# Patient Record
Sex: Male | Born: 1945 | Hispanic: No | Marital: Single | Smoking: Never smoker
Health system: Southern US, Community
[De-identification: ages and names within clinical notes are randomized; demographics above are authoritative.]

## PROBLEM LIST (undated history)

## (undated) DIAGNOSIS — N390 Urinary tract infection, site not specified: Secondary | ICD-10-CM

## (undated) DIAGNOSIS — D649 Anemia, unspecified: Secondary | ICD-10-CM

## (undated) HISTORY — DX: Urinary tract infection, site not specified: N39.0

## (undated) HISTORY — DX: Anemia, unspecified: D64.9

## (undated) HISTORY — PX: COLONOSCOPY: SHX174

---

## 2010-04-01 ENCOUNTER — Encounter: Admission: RE | Admit: 2010-04-01 | Discharge: 2010-04-01 | Payer: Self-pay | Admitting: Infectious Diseases

## 2010-07-12 ENCOUNTER — Emergency Department (HOSPITAL_COMMUNITY)
Admission: EM | Admit: 2010-07-12 | Discharge: 2010-07-12 | Payer: Self-pay | Source: Home / Self Care | Admitting: Family Medicine

## 2010-07-12 ENCOUNTER — Inpatient Hospital Stay (HOSPITAL_COMMUNITY): Admission: EM | Admit: 2010-07-12 | Discharge: 2010-07-16 | Payer: Self-pay | Admitting: Emergency Medicine

## 2010-07-13 ENCOUNTER — Ambulatory Visit: Payer: Self-pay | Admitting: Internal Medicine

## 2010-07-14 ENCOUNTER — Encounter: Payer: Self-pay | Admitting: Internal Medicine

## 2010-07-22 ENCOUNTER — Ambulatory Visit: Payer: Self-pay | Admitting: Internal Medicine

## 2010-07-22 ENCOUNTER — Encounter: Payer: Self-pay | Admitting: Internal Medicine

## 2010-07-22 DIAGNOSIS — N12 Tubulo-interstitial nephritis, not specified as acute or chronic: Secondary | ICD-10-CM | POA: Insufficient documentation

## 2010-07-22 DIAGNOSIS — D649 Anemia, unspecified: Secondary | ICD-10-CM

## 2010-07-22 LAB — CONVERTED CEMR LAB
Bilirubin Urine: NEGATIVE
Ketones, ur: NEGATIVE mg/dL
Ketones, urine, test strip: NEGATIVE
Leukocytes, UA: NEGATIVE
Nitrite: NEGATIVE
Protein, U semiquant: NEGATIVE
Specific Gravity, Urine: 1.014 (ref 1.005–1.0)
Urobilinogen, UA: 0.2
Urobilinogen, UA: 0.2 (ref 0.0–1.0)
pH: 6 (ref 5.0–8.0)

## 2010-07-23 LAB — CONVERTED CEMR LAB
BUN: 16 mg/dL (ref 6–23)
Chloride: 100 meq/L (ref 96–112)
Creatinine, Ser: 1.11 mg/dL (ref 0.40–1.50)
Glucose, Bld: 96 mg/dL (ref 70–99)
HCT: 36.3 % — ABNORMAL LOW (ref 39.0–52.0)
Hemoglobin: 11.6 g/dL — ABNORMAL LOW (ref 13.0–17.0)
LDL Cholesterol: 152 mg/dL — ABNORMAL HIGH (ref 0–99)
Potassium: 4 meq/L (ref 3.5–5.3)
RBC: 3.94 M/uL — ABNORMAL LOW (ref 4.22–5.81)
RDW: 13.1 % (ref 11.5–15.5)
Triglycerides: 158 mg/dL — ABNORMAL HIGH (ref <150)
VLDL: 32 mg/dL (ref 0–40)

## 2010-07-26 ENCOUNTER — Ambulatory Visit (HOSPITAL_COMMUNITY)
Admission: RE | Admit: 2010-07-26 | Discharge: 2010-07-26 | Payer: Self-pay | Source: Home / Self Care | Attending: Internal Medicine | Admitting: Internal Medicine

## 2010-07-27 ENCOUNTER — Encounter: Payer: Self-pay | Admitting: Internal Medicine

## 2010-08-02 ENCOUNTER — Encounter: Payer: Self-pay | Admitting: Internal Medicine

## 2010-09-14 NOTE — Procedures (Signed)
Summary: Colonoscopy  Patient: [name] Note: All result statuses are Final unless otherwise noted.  Tests: (1) Colonoscopy (COL)   COL Colonoscopy           DONE     [facility]     [address]     [place], [place]  [address]           COLONOSCOPY PROCEDURE REPORT           PATIENT:  Austin Thornton, Austin Thornton  MR#:  [id number]     BIRTHDATE:  Aug 15, 1946, 64 yrs. old  GENDER:  male     ENDOSCOPIST:  Iva Boop, MD, [facility]     REF. BY:  Mariea Stable, M.D.     PROCEDURE DATE:  07/14/2010     PROCEDURE:  Colonoscopy 14970     ASA CLASS:  Class II     INDICATIONS:  rectal bleeding     MEDICATIONS:   Fentanyl 50 mcg IV, Versed 5 mg IV           DESCRIPTION OF PROCEDURE:   After the risks benefits and     alternatives of the procedure were thoroughly explained, informed     consent was obtained.  Digital rectal exam was performed and     revealed no abnormalities and normal prostate.   The [id number]     ([id number]) endoscope was introduced through the anus and advanced     to the cecum, which was identified by both the appendix and     ileocecal valve, without limitations.  The quality of the prep was     excellent, using MoviPrep.  The instrument was then slowly     withdrawn as the colon was fully examined. Insertion 3 minutes     Withdrawal 6 minutes     <<PROCEDUREIMAGES>>           FINDINGS:  A normal appearing cecum, ileocecal valve, and     appendiceal orifice were identified. The ascending, hepatic     flexure, transverse, splenic flexure, descending, sigmoid colon,     and rectum appeared unremarkable. Includes right colon     retroflexion.   Retroflexed views in the rectum revealed internal     hemorrhoids.    The scope was then withdrawn from the patient and     the procedure completed.           COMPLICATIONS:  None     ENDOSCOPIC IMPRESSION:     1) Normal colon     2) Internal hemorrhoids     3) excellent prep     RECOMMENDATIONS:     As  needed hemorrhoid care.     treat B12 deficiency, that may be cause of anemia.           Iva Boop, MD, [facility]           n.     eSIGNED:   Iva Boop at 07/14/2010 03:33 PM           Jones Skene, [id number]  Note: An exclamation mark (!) indicates a result that was not dispersed into the flowsheet. Document Creation Date: 07/14/2010 3:40 PM _______________________________________________________________________  (1) Order result status: Final Collection or observation date-time: 07/14/2010 15:26 Requested date-time:  Receipt date-time:  Reported date-time:  Referring Physician:   Ordering Physician: Stan Head [phone]) Specimen Source:  Source: [name] Order Number: [id number] Lab site:

## 2010-09-14 NOTE — Assessment & Plan Note (Signed)
Summary: HFU/ NEW TO CLINIC/ SB.   Vital Signs:  Patient profile:   65 year old male Height:      65.5 inches Weight:      87.02 pounds BMI:     14.31 Temp:     96.8 degrees F oral Pulse rate:   71 / minute Pulse (ortho):   82 / minute BP sitting:   91 / 68  (left arm) BP standing:   102 / 69  (left arm) Cuff size:   small  Vitals Entered By: Angelina Ok RN (July 22, 2010 9:02 AM) Is Patient Diabetic? No Pain Assessment Patient in pain? no      Nutritional Status BMI of < 19 = underweight  Have you ever been in a relationship where you felt threatened, hurt or afraid?No   Does patient need assistance? Functional Status Self care Ambulation Normal Comments Uses an intrepreter.  Dizzines on standing. No nausea. No problems eating.  HFU. Labs.  Needs order for PICC line removal.  Send order to [facility].   History of Present Illness: Follow up from a hospitalization. Please, refer to a D/C summary dictaed by Dr. Allena Katz. Patient denies any dysuria, oliguria, polyuria, fever, chills, sweats, hematuria or hematechezia. Patient reports improved appetitie. Denies any concerns. Patient is on Ertapenem Day #8 via PICC line  Depression History:      The patient denies a depressed mood most of the day and a diminished interest in his usual daily activities.         Preventive Screening-Counseling & Management  Alcohol-Tobacco     Alcohol drinks/day: 0     Smoking Status: never  Problems Prior to Update: None  Current Problems (verified): 1)  Anemia  (ICD-285.9) 2)  Internal Hemorrhoids  (ICD-455.0) 3)  Upper Urinary Tract Infection  (ICD-590.80)  Medications Prior to Update: 1)  None  Current Medications (verified): 1)  Tylenol 325 Mg Tabs (Acetaminophen) .... Take One Tablet By Mouth Q 6 Hours Prn 2)  Protonix 40 Mg Tbec (Pantoprazole Sodium) .... Take One Tablet By Mouth Daily 3)  Cyanocobalamin 1000 Mcg/ml Soln (Cyanocobalamin) .... Take One Tablet By  Mouth Daily  Allergies (verified): No Known Drug Allergies  Directives (verified): 1)  Full Code   Past History:  Family History: Last updated: 07/22/2010 Unknown  Risk Factors: Smoking Status: never (07/22/2010)  Past medical, surgical, family and social histories (including risk factors) reviewed for relevance to current acute and chronic problems (from E-chart)  Past Medical History: E. Coli Upper UTI (06/2010) Anemia Internal Hemorroids (per Colonoscopy in 06/2010).  Past Surgical History: None  Family History: Reviewed history and no changes required. Unknown  Social History: Immigrated from [place] in 03/2010. Lives with his son. Medicaid. No tobacco, ETOH or drugsSmoking Status:  never  Review of Systems  The patient denies anorexia, fever, weight loss, weight gain, vision loss, decreased hearing, hoarseness, chest pain, syncope, dyspnea on exertion, peripheral edema, prolonged cough, headaches, hemoptysis, abdominal pain, melena, hematochezia, severe indigestion/heartburn, hematuria, incontinence, genital sores, muscle weakness, suspicious skin lesions, transient blindness, difficulty walking, depression, unusual weight change, abnormal bleeding, enlarged lymph nodes, angioedema, breast masses, and testicular masses.    Physical Exam  General:  Well-developed,well-nourished,in no acute distress; alert,appropriate and cooperative throughout examination Head:  Normocephalic and atraumatic without obvious abnormalities. No apparent alopecia or balding. Eyes:  No corneal or conjunctival inflammation noted. EOMI. Perrla. Funduscopic exam benign, without hemorrhages, exudates or papilledema. Vision grossly normal. Ears:  External ear exam  shows no significant lesions or deformities.  Otoscopic examination reveals clear canals, tympanic membranes are intact bilaterally without bulging, retraction, inflammation or discharge. Hearing is grossly normal bilaterally. Nose:   External nasal examination shows no deformity or inflammation. Nasal mucosa are pink and moist without lesions or exudates. Mouth:  Oral mucosa and oropharynx without lesions or exudates.  Teeth in good repair. Neck:  No deformities, masses, or tenderness noted. Lungs:  Normal respiratory effort, chest expands symmetrically. Lungs are clear to auscultation, no crackles or wheezes. Heart:  Normal rate and regular rhythm. S1 and S2 normal without gallop, murmur, click, rub or other extra sounds. Abdomen:  Bowel sounds positive,abdomen soft and non-tender without masses, organomegaly or hernias noted. Rectal:  no external abnormalities.  no external abnormalities.  no external abnormalities.   Msk:  No deformity or scoliosis noted of thoracic or lumbar spine.   Pulses:  R and L carotid,radial,femoral,dorsalis pedis and posterior tibial pulses are full and equal bilaterally Extremities:  No clubbing, cyanosis, edema, or deformity noted with normal full range of motion of all joints.   Neurologic:  No cranial nerve deficits noted. Station and gait are normal. Plantar reflexes are down-going bilaterally. DTRs are symmetrical throughout. Sensory, motor and coordinative functions appear intact. Skin:  Intact without suspicious lesions or rashes Cervical Nodes:  No lymphadenopathy noted Axillary Nodes:  No palpable lymphadenopathy Inguinal Nodes:  No significant adenopathy Psych:  Cognition and judgment appear intact. Alert and cooperative with normal attention span and concentration. No apparent delusions, illusions, hallucinations   Impression & Recommendations:  Problem # 1:  UPPER URINARY TRACT INFECTION (ICD-590.80) Assessment Improved  ES E. Coli UTI with pyelonephritis. Resolved. Still etiology of the patient's Sx is unlcear. Will order Renal US and refer to  urologist for Pyelogram/syctoscopy to eval for obstrxn and/or congenital GU defects. Will remove PICC line after last dose of Ertapenem  today. Orders: T-Urinalysis Dipstick only (16109UE) T-Basic Metabolic Panel ([id number]) T-Urinalysis ([id number]) T-Culture, Urine ([id number]) Urology Referral (Urology) Ultrasound (Ultrasound)  Encouraged to push clear liquids, get enough rest, and take acetaminophen as needed. To be seen in 10 days if no improvement, sooner if worse.  Problem # 2:  ANEMIA (ICD-285.9) Assessment: Comment Only  will repeat CBC today and follow up accordingly. Orders: T-CBC No Diff ([id number])  His updated medication list for this problem includes:    Cyanocobalamin 1000 Mcg/ml Soln (Cyanocobalamin) .Marland Kitchen... Take one tablet by mouth daily  His updated medication list for this problem includes:    Cyanocobalamin 1000 Mcg/ml Soln (Cyanocobalamin) .Marland Kitchen... Take one tablet by mouth daily  Medications Added to Medication List This Visit: 1)  Tylenol 325 Mg Tabs (Acetaminophen) .... Take one tablet by mouth q 6 hours prn 2)  Protonix 40 Mg Tbec (Pantoprazole sodium) .... Take one tablet by mouth daily 3)  Cyanocobalamin 1000 Mcg/ml Soln (Cyanocobalamin) .... Take one tablet by mouth daily  Complete Medication List: 1)  Tylenol 325 Mg Tabs (Acetaminophen) .... Take one tablet by mouth q 6 hours prn 2)  Protonix 40 Mg Tbec (Pantoprazole sodium) .... Take one tablet by mouth daily 3)  Cyanocobalamin 1000 Mcg/ml Soln (Cyanocobalamin) .... Take one tablet by mouth daily  Other Orders: T-Lipid Profile ([id number])  Patient Instructions: 1)  Please, follow up with a Urologist and a kidney ultrasound as instructed. 2)  Home Health nurse will remove PICC line. 3)  Please, call with any questions. 4)  please,folllow up in 2-4 weeks. 5)  Happy Holidays!!!! Prescriptions: CYANOCOBALAMIN 1000  MCG/ML SOLN (CYANOCOBALAMIN) Take one tablet by mouth daily  #30 x 11   Entered and Authorized by:   Deatra Robinson MD   Signed by:   Deatra Robinson MD on 07/22/2010   Method used:   Historical   RxID:    [id number] PROTONIX 40 MG TBEC (PANTOPRAZOLE SODIUM) Take one tablet by mouth daily  #30 x 0   Entered and Authorized by:   Deatra Robinson MD   Signed by:   Deatra Robinson MD on 07/22/2010   Method used:   Print then Give to Patient   RxID:   [id number]    Orders Added: 1)  T-Lipid Profile [[id number]] 2)  T-Urinalysis Dipstick only [81003QW] 3)  T-Basic Metabolic Panel [[id number]] 4)  T-CBC No Diff [[id number]] 5)  T-Urinalysis [81003-65000] 6)  Est. Patient Level IV [06301] 7)  T-Culture, Urine [[id number]] 8)  Urology Referral [Urology] 9)  Ultrasound [Ultrasound]    Prevention & Chronic Care Immunizations   Influenza vaccine: Not documented   Influenza vaccine due: 04/16/2011    Tetanus booster: Not documented   Tetanus booster due: 07/22/2020    Pneumococcal vaccine: Not documented   Pneumococcal vaccine due: 07/23/2015    H. zoster vaccine: Not documented   H. zoster vaccine deferral: Not available  (07/22/2010)  Colorectal Screening   Hemoccult: Not documented   Hemoccult due: 07/18/2011    Colonoscopy: DONE  (07/14/2010)   Colonoscopy due: 07/14/2020  Other Screening   PSA: Not documented   PSA action/deferral: Discussed-decision deferred  (07/22/2010)   PSA due due: 07/23/2011   Smoking status: never  (07/22/2010)  Lipids   Total Cholesterol: Not documented   Lipid panel action/deferral: Lipid Panel ordered   LDL: Not documented   LDL Direct: Not documented   HDL: Not documented   Triglycerides: Not documented   Lipid panel due: 07/23/2011   Nursing Instructions: Give Flu vaccine today Give tetanus booster today Give Pneumovax today    Process Orders Check Orders Results:     [facility]: ABN not required for this insurance Tests Sent for requisitioning (July 22, 2010 2:35 PM):     07/22/2010: [facility] -- T-Lipid Profile [phone] (signed)     07/22/2010: [facility] -- T-Basic Metabolic Panel [id number] (signed)     07/22/2010: [facility] -- T-CBC No Diff [[id number]] (signed)     07/22/2010: [facility] -- T-Urinalysis [[id number]] (signed)     07/22/2010: [facility] -- T-Culture, Urine [[id number]] (signed)      Vital Signs:  Patient profile:   65 year old male Height:      65.5 inches Weight:      87.02 pounds BMI:     14.31 Temp:     96.8 degrees F oral Pulse rate:   71 / minute Pulse (ortho):   82 / minute BP sitting:   91 / 68  (left arm) BP standing:   102 / 69  (left arm) Cuff size:   small  Vitals Entered By: Angelina Ok RN (July 22, 2010 9:02 AM)  Laboratory Results   Urine Tests  Date/Time Recieved: 07/22/2010 8:42 AM GH Date/Time Reported: 07/22/2010 8:44 AM GH  Routine Urinalysis   Color: yellow Appearance: Clear Glucose: negative   (Normal Range: Negative) Bilirubin: negative   (Normal Range: Negative) Ketone: negative   (Normal Range: Negative) Spec. Gravity: 1.025   (Normal Range: 1.003-1.035) Blood: trace-lysed   (Normal Range: Negative) pH: 5.5   (Normal  Range: 5.0-8.0) Protein: negative   (Normal Range: Negative) Urobilinogen: 0.2   (Normal Range: 0-1) Nitrite: negative   (Normal Range: Negative) Leukocyte Esterace: trace   (Normal Range: Negative)      Unable to drawn blood from right arm PICC line.  Chinita Pester RN  July 22, 2010 10:16 AM

## 2010-09-16 NOTE — Miscellaneous (Signed)
Summary: [facility]  [facility]   Imported By: Shon Hough 08/27/2010 13:49:22  _____________________________________________________________________  External Attachment:    Type:   Image     Comment:   External Document

## 2010-09-16 NOTE — Miscellaneous (Signed)
Summary: PATIENT CONSENT FORM  PATIENT CONSENT FORM   Imported By: Louretta Parma 08/27/2010 14:05:57  _____________________________________________________________________  External Attachment:    Type:   Image     Comment:   External Document

## 2010-09-16 NOTE — Miscellaneous (Signed)
Summary: [facility]  [facility]VANCED ORDERS   Imported By: Shon Hough 07/29/2010 15:25:39  _____________________________________________________________________  External Attachment:    Type:   Image     Comment:   External Document

## 2010-10-27 LAB — BASIC METABOLIC PANEL
BUN: 10 mg/dL (ref 6–23)
BUN: 5 mg/dL — ABNORMAL LOW (ref 6–23)
Calcium: 8.7 mg/dL (ref 8.4–10.5)
Creatinine, Ser: 1.13 mg/dL (ref 0.4–1.5)
GFR calc non Af Amer: >60 mL/min (ref >60)
GFR calc non Af Amer: >60 mL/min (ref >60)
Glucose, Bld: 91 mg/dL (ref 70–99)
Glucose, Bld: 96 mg/dL (ref 70–99)
Potassium: 4.6 mEq/L (ref 3.5–5.1)
Sodium: 142 mEq/L (ref 135–145)

## 2010-10-27 LAB — URINE MICROSCOPIC-ADD ON

## 2010-10-27 LAB — TYPE AND SCREEN: Antibody Screen: NEGATIVE

## 2010-10-27 LAB — URINE CULTURE: Colony Count: >=100000

## 2010-10-27 LAB — PROTIME-INR
INR: 1.1 (ref 0.00–1.49)
Prothrombin Time: 14.4 seconds (ref 11.6–15.2)

## 2010-10-27 LAB — POCT URINALYSIS DIPSTICK
Bilirubin Urine: NEGATIVE
Glucose, UA: NEGATIVE mg/dL
Ketones, ur: NEGATIVE mg/dL
Nitrite: POSITIVE — AB
pH: 5.5 (ref 5.0–8.0)

## 2010-10-27 LAB — URINALYSIS, ROUTINE W REFLEX MICROSCOPIC
Bilirubin Urine: NEGATIVE
Nitrite: POSITIVE — AB
Specific Gravity, Urine: 1.016 (ref 1.005–1.030)
Urobilinogen, UA: 0.2 mg/dL (ref 0.0–1.0)
pH: 6 (ref 5.0–8.0)

## 2010-10-27 LAB — COMPREHENSIVE METABOLIC PANEL
AST: 25 U/L (ref 0–37)
Albumin: 4.1 g/dL (ref 3.5–5.2)
Calcium: 10 mg/dL (ref 8.4–10.5)
Creatinine, Ser: 1.19 mg/dL (ref 0.4–1.5)
GFR calc Af Amer: >60 mL/min (ref >60)
Sodium: 139 mEq/L (ref 135–145)

## 2010-10-27 LAB — RETICULOCYTES
RBC.: 3.58 MIL/uL — ABNORMAL LOW (ref 4.22–5.81)
Retic Count, Absolute: 25.1 10*3/uL (ref 19.0–186.0)

## 2010-10-27 LAB — FOLATE: Folate: 8.3 ng/mL

## 2010-10-27 LAB — HEMOGLOBIN AND HEMATOCRIT, BLOOD
HCT: 30.6 % — ABNORMAL LOW (ref 39.0–52.0)
Hemoglobin: 10.2 g/dL — ABNORMAL LOW (ref 13.0–17.0)

## 2010-10-27 LAB — DIFFERENTIAL
Eosinophils Relative: 0 % (ref 0–5)
Lymphocytes Relative: 18 % (ref 12–46)
Lymphs Abs: 1.6 10*3/uL (ref 0.7–4.0)
Monocytes Absolute: 1.2 10*3/uL — ABNORMAL HIGH (ref 0.1–1.0)
Monocytes Relative: 13 % — ABNORMAL HIGH (ref 3–12)

## 2010-10-27 LAB — CBC
HCT: 32.8 % — ABNORMAL LOW (ref 39.0–52.0)
Hemoglobin: 10.7 g/dL — ABNORMAL LOW (ref 13.0–17.0)
Hemoglobin: 9.9 g/dL — ABNORMAL LOW (ref 13.0–17.0)
MCH: 30.9 pg (ref 26.0–34.0)
MCHC: 32.9 g/dL (ref 30.0–36.0)
MCHC: 33.2 g/dL (ref 30.0–36.0)
MCHC: 33.6 g/dL (ref 30.0–36.0)
MCHC: 34.3 g/dL (ref 30.0–36.0)
Platelets: 146 10*3/uL — ABNORMAL LOW (ref 150–400)
Platelets: 171 10*3/uL (ref 150–400)
Platelets: 203 10*3/uL (ref 150–400)
RBC: 4.3 MIL/uL (ref 4.22–5.81)
RDW: 12.8 % (ref 11.5–15.5)
RDW: 12.9 % (ref 11.5–15.5)

## 2010-10-27 LAB — HOMOCYSTEINE: Homocysteine: 20.2 umol/L — ABNORMAL HIGH (ref 4.0–15.4)

## 2010-10-27 LAB — FERRITIN: Ferritin: 151 ng/mL (ref 22–322)

## 2010-10-27 LAB — IRON AND TIBC: Iron: 15 ug/dL — ABNORMAL LOW (ref 42–135)

## 2010-10-27 LAB — METHYLMALONIC ACID, SERUM: Methylmalonic Acid, Quantitative: 139 nmol/L (ref 87–318)

## 2010-10-27 LAB — ABO/RH: ABO/RH(D): A POS

## 2010-11-23 ENCOUNTER — Encounter: Payer: Self-pay | Admitting: Internal Medicine

## 2011-03-02 ENCOUNTER — Emergency Department (HOSPITAL_COMMUNITY)
Admission: EM | Admit: 2011-03-02 | Discharge: 2011-03-03 | Disposition: A | Discharge status: Home / Self Care | Payer: Self-pay | Attending: Emergency Medicine | Admitting: Emergency Medicine

## 2011-03-02 DIAGNOSIS — R079 Chest pain, unspecified: Secondary | ICD-10-CM | POA: Insufficient documentation

## 2011-03-02 DIAGNOSIS — R109 Unspecified abdominal pain: Secondary | ICD-10-CM | POA: Insufficient documentation

## 2011-03-02 DIAGNOSIS — K625 Hemorrhage of anus and rectum: Secondary | ICD-10-CM | POA: Insufficient documentation

## 2011-03-02 DIAGNOSIS — W010XXA Fall on same level from slipping, tripping and stumbling without subsequent striking against object, initial encounter: Secondary | ICD-10-CM | POA: Insufficient documentation

## 2011-03-02 LAB — CBC
MCH: 30.3 pg (ref 26.0–34.0)
MCHC: 35 g/dL (ref 30.0–36.0)
MCV: 86.6 fL (ref 78.0–100.0)
Platelets: 169 10*3/uL (ref 150–400)

## 2011-03-02 LAB — DIFFERENTIAL
Basophils Relative: 0 % (ref 0–1)
Eosinophils Absolute: 0.1 10*3/uL (ref 0.0–0.7)
Eosinophils Relative: 1 % (ref 0–5)
Lymphs Abs: 2.6 10*3/uL (ref 0.7–4.0)
Monocytes Absolute: 0.7 10*3/uL (ref 0.1–1.0)
Monocytes Relative: 10 % (ref 3–12)

## 2011-03-02 LAB — BASIC METABOLIC PANEL
Calcium: 9.9 mg/dL (ref 8.4–10.5)
Creatinine, Ser: 1.04 mg/dL (ref 0.50–1.35)
GFR calc non Af Amer: >60 mL/min (ref >60)
Sodium: 138 mEq/L (ref 135–145)

## 2011-03-03 ENCOUNTER — Encounter: Payer: Self-pay | Admitting: Gastroenterology

## 2011-03-03 ENCOUNTER — Emergency Department (HOSPITAL_COMMUNITY): Payer: Self-pay

## 2011-03-03 LAB — OCCULT BLOOD, POC DEVICE: Fecal Occult Bld: POSITIVE

## 2011-03-04 ENCOUNTER — Telehealth: Payer: Self-pay | Admitting: Gastroenterology

## 2011-03-04 NOTE — Telephone Encounter (Signed)
Dr Leone Payor, you did COLON as an in pt 07/14/2010 with Hemorrhoids and B12 deficiency found. Pt was to f/u with [place] and never did. Pt was given an appt with Dr Russella Dar per ER visit on 03/03/11.  Should pt be yours or go to MD with 1st available appt? Thanks.

## 2011-03-07 NOTE — Telephone Encounter (Signed)
Spoke with another grandson, not Buddha, who stated pt was still having problems with rectal bleeding- stated nothing has changed since ER visit. Scheduled pt to see Willette Cluster, NP on 03/10/11 at 1:30pm. Dr Leone Payor, Lavonna Rua stated pt will become yours since you had the only contact if that's ok. Please advise.

## 2011-03-07 NOTE — Telephone Encounter (Signed)
According to our policies the MD that did the patient's original consult is the one they follow with. If you cannot determine that I will see the patient as that would be the next closest thing. If this is an urgent issue then I believe we have agreed to help each other out and work them in - you can check with Aurea Graff on this.

## 2011-03-07 NOTE — Telephone Encounter (Signed)
I'll take the patient

## 2011-03-10 ENCOUNTER — Ambulatory Visit (INDEPENDENT_AMBULATORY_CARE_PROVIDER_SITE_OTHER): Payer: Self-pay | Admitting: Nurse Practitioner

## 2011-03-10 ENCOUNTER — Encounter: Payer: Self-pay | Admitting: Nurse Practitioner

## 2011-03-10 VITALS — BP 92/60 | HR 72 | Ht 64.0 in | Wt 86.4 lb

## 2011-03-10 DIAGNOSIS — D649 Anemia, unspecified: Secondary | ICD-10-CM

## 2011-03-10 DIAGNOSIS — K625 Hemorrhage of anus and rectum: Secondary | ICD-10-CM

## 2011-03-10 MED ORDER — HYDROCORTISONE ACETATE 25 MG RE SUPP: RECTAL | Status: DC

## 2011-03-10 MED ORDER — POLYETHYLENE GLYCOL 3350 17 G PO PACK: PACK | ORAL | Status: DC

## 2011-03-10 NOTE — Patient Instructions (Signed)
Pt verbally instructed in the room by Willette Cluster with interpreter.

## 2011-03-14 ENCOUNTER — Encounter: Payer: Self-pay | Admitting: Nurse Practitioner

## 2011-03-14 DIAGNOSIS — K625 Hemorrhage of anus and rectum: Secondary | ICD-10-CM | POA: Insufficient documentation

## 2011-03-14 NOTE — Assessment & Plan Note (Addendum)
Rectal exam unremarkable though digital exam limited secondary to discomfort. Suspect bleeding from internal hemorrhoids or unvisualized fissure. Hemoglobin actually better than in November 2011 despite reports of daily bleeding. Will begin steroid suppositories twice daily. Will touch base with patient's grandson next week for condition update.

## 2011-03-14 NOTE — Progress Notes (Signed)
Austin Thornton [id number] 08/21/1945   HISTORY OR PRESENT ILLNESS : Patient is a 65 year old Non English speaking male from [place], here with interpreter. Patient has chronic, intermittent rectal bleeding. Hospitalized in November 2011 for rectal bleeding and colonoscopy at that time was normal except for hemorrhoids. Patient does get monthly B12 injections for B12 deficiency. He complains of associated lower abdominal and rectal discomfort. Patient went to ER a few days ago for evaluation of bleeding and hemoglobin was 12.9, up from 10.7 in November. His MCV is normal at 86. Patient feels urge to defecate 2-4 times a days but despite straining, he has only 1-2 BMs a day. Not taking anything for constipation or using anything for hemorrhoids.   Current Medications, Allergies, Past Medical History, Past Surgical History, Family History and Social History were reviewed in [employer] record.   PHYSICAL EXAMINATION : General: thin, white male in no acute distress Head: Normocephalic and atraumatic Eyes:  sclerae anicteric,conjunctive pink. Ears: Normal auditory acuity Mouth: No deformity or lesions Neck: Supple, no masses.  Lungs: Clear throughout to auscultation Heart: Regular rate and rhythm; no murmurs heard Abdomen: Soft, nondistended, nontender. No masses or hepatomegaly noted. Normal bowel sounds Rectal: No external lesions. Only partial digital exam done secondary to patient's discomfort. No fissures seen. Light brown, heme negative stool. Musculoskeletal: Symmetrical with no gross deformities  Skin: No lesions on visible extremities Extremities: No edema or deformities noted Neurological: Alert oriented x 4, grossly nonfocal Cervical Nodes:  No significant cervical adenopathy Psychological:  Alert and cooperative. Normal mood and affect  ASSESSMENT AND PLAN :

## 2011-03-17 ENCOUNTER — Encounter: Payer: Self-pay | Admitting: Nurse Practitioner

## 2011-03-17 NOTE — Assessment & Plan Note (Signed)
Normocytic anemia, history of B12 deficiency. Hgb improved on B12 injections (despite reports of daily rectal bleeding).

## 2011-03-18 NOTE — Progress Notes (Signed)
Reviewed and agree with management. Carl E. Gessner, MD, FACG  

## 2011-03-31 ENCOUNTER — Telehealth: Payer: Self-pay | Admitting: *Deleted

## 2011-03-31 NOTE — Telephone Encounter (Signed)
Spoke with patient's family and they state the bleeding has stopped with the suppositories.

## 2011-03-31 NOTE — Telephone Encounter (Signed)
Left a message for patient's family to call me and update me on how he is doing

## 2011-03-31 NOTE — Telephone Encounter (Signed)
Message copied by Daphine Deutscher on Thu Mar 31, 2011  9:05 AM ------      Message from: Meredith Pel      Created: Wed Mar 30, 2011 10:18 PM       Rene Kocher, will you call patient and see if bleeding stopped with suppositories. His grandson speaks [place].      Gunnar Fusi      ----- Message -----         From: Iva Boop, MD         Sent: 03/18/2011   9:22 AM           To: Meredith Pel, NP

## 2011-04-06 ENCOUNTER — Ambulatory Visit: Payer: Self-pay | Admitting: Gastroenterology

## 2014-04-19 ENCOUNTER — Observation Stay (HOSPITAL_COMMUNITY)
Admission: EM | Admit: 2014-04-19 | Discharge: 2014-04-21 | Disposition: A | Discharge status: Home / Self Care | Payer: Medicaid Other | Attending: Internal Medicine | Admitting: Internal Medicine

## 2014-04-19 ENCOUNTER — Emergency Department (HOSPITAL_COMMUNITY): Payer: Medicaid Other

## 2014-04-19 ENCOUNTER — Encounter (HOSPITAL_COMMUNITY): Payer: Self-pay | Admitting: Emergency Medicine

## 2014-04-19 DIAGNOSIS — M545 Low back pain, unspecified: Secondary | ICD-10-CM | POA: Diagnosis not present

## 2014-04-19 DIAGNOSIS — G8929 Other chronic pain: Secondary | ICD-10-CM | POA: Insufficient documentation

## 2014-04-19 DIAGNOSIS — R4182 Altered mental status, unspecified: Secondary | ICD-10-CM | POA: Diagnosis not present

## 2014-04-19 DIAGNOSIS — I6529 Occlusion and stenosis of unspecified carotid artery: Secondary | ICD-10-CM | POA: Insufficient documentation

## 2014-04-19 DIAGNOSIS — R404 Transient alteration of awareness: Secondary | ICD-10-CM | POA: Diagnosis present

## 2014-04-19 DIAGNOSIS — G319 Degenerative disease of nervous system, unspecified: Secondary | ICD-10-CM | POA: Insufficient documentation

## 2014-04-19 DIAGNOSIS — E538 Deficiency of other specified B group vitamins: Secondary | ICD-10-CM | POA: Diagnosis not present

## 2014-04-19 DIAGNOSIS — G459 Transient cerebral ischemic attack, unspecified: Secondary | ICD-10-CM | POA: Diagnosis present

## 2014-04-19 DIAGNOSIS — R7309 Other abnormal glucose: Secondary | ICD-10-CM | POA: Diagnosis not present

## 2014-04-19 LAB — COMPREHENSIVE METABOLIC PANEL
ALT: 11 U/L (ref 0–53)
AST: 20 U/L (ref 0–37)
Albumin: 4.3 g/dL (ref 3.5–5.2)
Alkaline Phosphatase: 39 U/L (ref 39–117)
Anion gap: 14 (ref 5–15)
BUN: 14 mg/dL (ref 6–23)
CHLORIDE: 102 meq/L (ref 96–112)
CO2: 21 meq/L (ref 19–32)
CREATININE: 1.18 mg/dL (ref 0.50–1.35)
Calcium: 9.8 mg/dL (ref 8.4–10.5)
GFR calc Af Amer: 72 mL/min — ABNORMAL LOW (ref >90)
GFR, EST NON AFRICAN AMERICAN: 62 mL/min — AB (ref >90)
Glucose, Bld: 125 mg/dL — ABNORMAL HIGH (ref 70–99)
Potassium: 4.2 mEq/L (ref 3.7–5.3)
Sodium: 137 mEq/L (ref 137–147)
Total Protein: 7.3 g/dL (ref 6.0–8.3)

## 2014-04-19 LAB — CBC
HCT: 36.8 % — ABNORMAL LOW (ref 39.0–52.0)
HEMATOCRIT: 36.9 % — AB (ref 39.0–52.0)
HEMOGLOBIN: 12.7 g/dL — AB (ref 13.0–17.0)
HEMOGLOBIN: 12.4 g/dL — AB (ref 13.0–17.0)
MCH: 29.8 pg (ref 26.0–34.0)
MCH: 29.1 pg (ref 26.0–34.0)
MCHC: 34.5 g/dL (ref 30.0–36.0)
MCHC: 33.6 g/dL (ref 30.0–36.0)
MCV: 86.4 fL (ref 78.0–100.0)
MCV: 86.6 fL (ref 78.0–100.0)
PLATELETS: 179 10*3/uL (ref 150–400)
Platelets: 166 10*3/uL (ref 150–400)
RBC: 4.26 MIL/uL (ref 4.22–5.81)
RBC: 4.26 MIL/uL (ref 4.22–5.81)
RDW: 14 % (ref 11.5–15.5)
RDW: 14.2 % (ref 11.5–15.5)
WBC: 7.6 10*3/uL (ref 4.0–10.5)
WBC: 6.7 10*3/uL (ref 4.0–10.5)

## 2014-04-19 LAB — ETHANOL

## 2014-04-19 LAB — URINALYSIS, ROUTINE W REFLEX MICROSCOPIC
Bilirubin Urine: NEGATIVE
GLUCOSE, UA: NEGATIVE mg/dL
HGB URINE DIPSTICK: NEGATIVE
KETONES UR: NEGATIVE mg/dL
Leukocytes, UA: NEGATIVE
Nitrite: NEGATIVE
PROTEIN: NEGATIVE mg/dL
Specific Gravity, Urine: 1.014 (ref 1.005–1.030)
Urobilinogen, UA: 0.2 mg/dL (ref 0.0–1.0)
pH: 5.5 (ref 5.0–8.0)

## 2014-04-19 LAB — I-STAT TROPONIN, ED: TROPONIN I, POC: 0.01 ng/mL (ref 0.00–0.08)

## 2014-04-19 LAB — TSH: TSH: 0.873 u[IU]/mL (ref 0.350–4.500)

## 2014-04-19 LAB — CREATININE, SERUM
Creatinine, Ser: 1.24 mg/dL (ref 0.50–1.35)
GFR, EST AFRICAN AMERICAN: 68 mL/min — AB (ref >90)
GFR, EST NON AFRICAN AMERICAN: 58 mL/min — AB (ref >90)

## 2014-04-19 LAB — CBG MONITORING, ED: Glucose-Capillary: 120 mg/dL — ABNORMAL HIGH (ref 70–99)

## 2014-04-19 MED ORDER — STROKE: EARLY STAGES OF RECOVERY BOOK
Freq: Once | Status: AC
  Administered 2014-04-19: 19:00:00
  Filled 2014-04-19: qty 1

## 2014-04-19 MED ORDER — ASPIRIN 325 MG PO TABS
325.0000 mg | ORAL_TABLET | Freq: Every day | ORAL | Status: DC
  Administered 2014-04-19 – 2014-04-21 (×3): 325 mg via ORAL
  Filled 2014-04-19 (×3): qty 1

## 2014-04-19 MED ORDER — ENOXAPARIN SODIUM 40 MG/0.4ML ~~LOC~~ SOLN
40.0000 mg | SUBCUTANEOUS | Status: DC
  Administered 2014-04-19 – 2014-04-20 (×2): 40 mg via SUBCUTANEOUS
  Filled 2014-04-19 (×2): qty 0.4

## 2014-04-19 MED ORDER — SENNOSIDES-DOCUSATE SODIUM 8.6-50 MG PO TABS: 1.0000 | ORAL_TABLET | Freq: Every evening | ORAL | Status: DC | PRN

## 2014-04-19 MED ORDER — ASPIRIN 300 MG RE SUPP: 300.0000 mg | Freq: Every day | RECTAL | Status: DC

## 2014-04-19 NOTE — ED Notes (Signed)
Pt speaks [place], per family pt was normal last night 11pm. Today but having "thick tongue" or slurred speech, repetitive actions and not answering their questions appropriately. Pt has no complaints. Airway intact.

## 2014-04-19 NOTE — ED Notes (Signed)
Attempted report x1. 

## 2014-04-19 NOTE — ED Provider Notes (Signed)
Medical screening examination/treatment/procedure(s) were conducted as a shared visit with non-physician practitioner(s) and myself.  I personally evaluated the patient during the encounter.   EKG Interpretation   Date/Time:  Saturday April 19 2014 15:03:14 EDT Ventricular Rate:  71 PR Interval:  144 QRS Duration: 80 QT Interval:  356 QTC Calculation: 386 R Axis:   88 Text Interpretation:  Normal sinus rhythm Minimal voltage criteria for  LVH, may be normal variant Septal infarct , age undetermined Abnormal ECG  No significant change since last tracing Confirmed by WARD,  DO, KRISTEN  [phone]) on 04/19/2014 3:05:53 PM      68 yo male with onset of confusion, bizarre behavior (up and down all night, praying - which is unusual for patient), and slurred speech which all started last night.  His son states his symptoms are resolved except for some very mild persistent slurred speech.  On exam, well appearing, nontoxic, not distressed, normal respiratory effort, normal perfusion, alert, CN II-XI intact within limitations of language barrier, 5/5 strength in RUE and RLE, 4+/5 strength in LUE and LLE.  Plan admit for stroke/TIA workup.  Clinical Impression: 1. Altered level of consciousness        Merrie Roof, MD 04/19/14 2250

## 2014-04-19 NOTE — ED Provider Notes (Signed)
CSN: [id number]     Arrival date & time 04/19/14  1446 History   First MD Initiated Contact with Patient 04/19/14 1601     Chief Complaint  Patient presents with  . Altered Mental Status     (Consider location/radiation/quality/duration/timing/severity/associated sxs/prior Treatment) HPI Comments: Patient who speaks Nepali, without significant medical problems -- presents with c/o altered LOC starting late last night and lasting 4-5 hours. History is obtained through son and patient with son acting as interpreter. Per son, patient was awake last night with abnormal behavior, reading and praying, repetitively going to the bathroom, with 'thick' speech and inappropriate answers to questions. He was using appropriate words. No reported facial droop, weakness/numbness in extremities, imbalance/trouble walking. No fever, URI symptoms, cough, abd pain, uinary symptoms. No N/V/D. Pt states he had some mild central CP. He has had HA but this is chronic. He also has chronic leg and back pains. No treatments PTA. No h/o: previous CVA, HTN, high cholesterol, DM, family history of CVA. Symptoms are now resolved and patient is at baseline.    Patient is a 68 y.o. male presenting with altered mental status.  Altered Mental Status Presenting symptoms: confusion   Associated symptoms: headaches (not new)   Associated symptoms: no fever, no light-headedness, no nausea, no rash, no vomiting and no weakness     Past Medical History  Diagnosis Date  . Anemia     2/2 Vit B12 injection. On monthly B12 injections/  . UTI (lower urinary tract infection)     2/2 E.Coli   History reviewed. No pertinent past surgical history. History reviewed. No pertinent family history. History  Substance Use Topics  . Smoking status: Never Smoker   . Smokeless tobacco: Not on file  . Alcohol Use: No    Review of Systems  Constitutional: Negative for fever.  HENT: Negative for congestion, dental problem, rhinorrhea and  sinus pressure.   Eyes: Negative for photophobia, discharge, redness and visual disturbance.  Respiratory: Negative for shortness of breath.   Cardiovascular: Positive for chest pain (not new). Negative for leg swelling.  Gastrointestinal: Negative for nausea and vomiting.  Musculoskeletal: Positive for back pain. Negative for gait problem, neck pain and neck stiffness.  Skin: Negative for rash.  Neurological: Positive for speech difficulty and headaches (not new). Negative for syncope, weakness, light-headedness and numbness.  Psychiatric/Behavioral: Positive for confusion.   Allergies  Review of patient's allergies indicates no known allergies.  Home Medications   Prior to Admission medications   Medication Sig Start Date End Date Taking? Authorizing Provider  acetaminophen (TYLENOL) 325 MG tablet Take 650 mg by mouth every 6 (six) hours as needed for mild pain.    Yes Historical Provider, MD  cyanocobalamin (,VITAMIN B-12,) 1000 MCG/ML injection Inject 1,000 mcg into the muscle every 30 (thirty) days.     Yes Historical Provider, MD  pantoprazole (PROTONIX) 40 MG tablet Take 40 mg by mouth daily.     Yes Historical Provider, MD   BP 123/80  Pulse 72  Resp 18  SpO2 96%  Physical Exam  Nursing note and vitals reviewed. Constitutional: He is oriented to person, place, and time. He appears well-developed and well-nourished.  HENT:  Head: Normocephalic and atraumatic.  Right Ear: Tympanic membrane, external ear and ear canal normal.  Left Ear: Tympanic membrane, external ear and ear canal normal.  Nose: Nose normal.  Mouth/Throat: Uvula is midline, oropharynx is clear and moist and mucous membranes are normal.  Eyes: Conjunctivae, EOM and  lids are normal. Pupils are equal, round, and reactive to light.  Neck: Normal range of motion. Neck supple.  No carotid bruit.   Cardiovascular: Normal rate and regular rhythm.   Pulmonary/Chest: Effort normal and breath sounds normal.   Abdominal: Soft. There is no tenderness.  Musculoskeletal: Normal range of motion.       Cervical back: He exhibits normal range of motion, no tenderness and no bony tenderness.  Neurological: He is alert and oriented to person, place, and time. He has normal strength and normal reflexes. No cranial nerve deficit or sensory deficit. He exhibits normal muscle tone. He displays a negative Romberg sign. Coordination normal. GCS eye subscore is 4. GCS verbal subscore is 5. GCS motor subscore is 6.  Skin: Skin is warm and dry.  Psychiatric: He has a normal mood and affect.    ED Course  Procedures (including critical care time) Labs Review Labs Reviewed  CBC - Abnormal; Notable for the following:    Hemoglobin 12.7 (*)    HCT 36.8 (*)    All other components within normal limits  COMPREHENSIVE METABOLIC PANEL - Abnormal; Notable for the following:    Glucose, Bld 125 (*)    Total Bilirubin <0.2 (*)    GFR calc non Af Amer 62 (*)    GFR calc Af Amer 72 (*)    All other components within normal limits  CBG MONITORING, ED - Abnormal; Notable for the following:    Glucose-Capillary 120 (*)    All other components within normal limits  URINALYSIS, ROUTINE W REFLEX MICROSCOPIC  HEMOGLOBIN A1C  LIPID PANEL  CBC  CREATININE, SERUM  TSH  VITAMIN B12  ETHANOL  URINE RAPID DRUG SCREEN (HOSP PERFORMED)  COMPREHENSIVE METABOLIC PANEL  I-STAT TROPOININ, ED    Imaging Review Dg Chest 2 View  04/19/2014   CLINICAL DATA:  Altered mental status.  EXAM: CHEST  2 VIEW  COMPARISON:  03/03/2011  FINDINGS: Lungs are adequately inflated without consolidation or effusion. The cardiomediastinal silhouette is within normal. There is minimal calcified plaque over the aortic arch. Remainder the exam is unremarkable.  IMPRESSION: No active cardiopulmonary disease.   Electronically Signed   By: Elberta Fortis M.D.   On: 04/19/2014 17:18   Ct Head Wo Contrast  04/19/2014   CLINICAL DATA:  Altered level of  consciousness  EXAM: CT HEAD WITHOUT CONTRAST  TECHNIQUE: Contiguous axial images were obtained from the base of the skull through the vertex without intravenous contrast.  COMPARISON:  None.  FINDINGS: No acute hemorrhage, infarct, or mass lesion is identified. No midline shift. Ventricles are normal in size. Orbits and paranasal sinuses are unremarkable. No skull fracture. Mild ethmoid mucoperiosteal thickening noted. No skull fracture. Orbits are grossly unremarkable.  IMPRESSION: No acute intracranial finding.   Electronically Signed   By: Christiana Pellant M.D.   On: 04/19/2014 16:59     EKG Interpretation   Date/Time:  Saturday April 19 2014 15:03:14 EDT Ventricular Rate:  71 PR Interval:  144 QRS Duration: 80 QT Interval:  356 QTC Calculation: 386 R Axis:   88 Text Interpretation:  Normal sinus rhythm Minimal voltage criteria for  LVH, may be normal variant Septal infarct , age undetermined Abnormal ECG  No significant change since last tracing Confirmed by WARD,  DO, KRISTEN  ([id number]) on 04/19/2014 3:05:53 PM      4:15 PM Patient seen and examined. Work-up initiated.    Vital signs reviewed and are as follows: BP 123/80  Pulse 72  Resp 18  SpO2 96%  5:53 PM Patient discussed with and seen by Dr. Loretha Stapler.   Work-up unremarkable. ABCD2 = 4.   Given possible TIA -- will admit.   Spoke with Dr. Susie Cassette who will see.    MDM   Final diagnoses:  Altered level of consciousness   ALOC - now essentially resolved. Possible TIA, no prior work-up. Admit.     Renne Crigler, PA-C 04/19/14 1754  Renne Crigler, PA-C 04/19/14 1755

## 2014-04-19 NOTE — H&P (Signed)
[facility] History and Physical  Austin Thornton ZOX:[id number] DOB: 1945-12-23 DOA: 04/19/2014  Referring physician:   PCP: Lonia Blood, MD   Chief Complaint: Altered mental status HPI:  68 year old male from [place], with no significant past medical history except for lower back pain, previous history of rectal bleeding, vitamin B12 deficiency brought in by his son because of confusion and altered mental status starting late last night, lasting for about 4-5 hours. History was obtained from the son acting as an interpreter.Per son, patient was awake last night with abnormal behavior, reading and praying, repetitively going to the bathroom, with 'thick' speech and inappropriate answers to questions. He was using appropriate words. No reported facial droop, weakness/numbness in extremities, imbalance/trouble walking. No diplopia, slurred speech, nausea vomiting. He did complain of some mild central chest pain.No h/o: previous CVA, HTN, high cholesterol, DM, family history of CVA. Symptoms are now resolved and patient is at baseline.  CT of the head negative, chest x-ray negative, urine negative Apparently has not seen a PCP since 2012      Review of Systems: negative for the following  Cardiovascular: Positive for chest pain (not new). Negative for leg swelling.  Gastrointestinal: Negative for nausea and vomiting.  Musculoskeletal: Positive for back pain. Negative for gait problem, neck pain and neck stiffness.  Skin: Negative for rash.  Neurological: Positive for speech difficulty and headaches (not new). Negative for syncope, weakness, light-headedness and numbness.  Genitourinary: Denies dysuria, urgency, frequency, hematuria, flank pain and difficulty urinating.  Musculoskeletal: Denies myalgias, back pain, joint swelling, arthralgias and gait problem.  Skin: Denies pallor, rash and wound.  Neurological: Denies dizziness, seizures, syncope, weakness, light-headedness, numbness and  headaches.  Hematological: Denies adenopathy. Easy bruising, personal or family bleeding history  Psychiatric/Behavioral: Positive for confusion Denies suicidal ideation, mood changes, confusion, nervousness, sleep disturbance and agitation       Past Medical History  Diagnosis Date  . Anemia     2/2 Vit B12 injection. On monthly B12 injections/  . UTI (lower urinary tract infection)     2/2 E.Coli     History reviewed. No pertinent past surgical history.    Social History:  reports that he has never smoked. He does not have any smokeless tobacco history on file. He reports that he does not drink alcohol or use illicit drugs.    No Known Allergies  History reviewed. No pertinent family history.   Prior to Admission medications   Medication Sig Start Date End Date Taking? Authorizing Provider  acetaminophen (TYLENOL) 325 MG tablet Take 650 mg by mouth every 6 (six) hours as needed for mild pain.    Yes Historical Provider, MD  cyanocobalamin (,VITAMIN B-12,) 1000 MCG/ML injection Inject 1,000 mcg into the muscle every 30 (thirty) days.     Yes Historical Provider, MD  pantoprazole (PROTONIX) 40 MG tablet Take 40 mg by mouth daily.     Yes Historical Provider, MD     Physical Exam: Filed Vitals:   04/19/14 1502 04/19/14 1600 04/19/14 1625 04/19/14 1630  BP: 123/80 147/85  134/76  Pulse: 72 64  63  Temp:   98.1 F (36.7 C)   TempSrc:   Oral   Resp: SpO2: 96% 100%  100%     Constitutional: Vital signs reviewed. Patient is a well-developed and well-nourished in no acute distress and cooperative with exam. Alert and oriented x3.  Head: Normocephalic and atraumatic  Ear: TM normal bilaterally  Mouth: no erythema or  exudates, MMM  Eyes: PERRL, EOMI, conjunctivae normal, No scleral icterus.  Neck: Supple, Trachea midline normal ROM, No JVD, mass, thyromegaly, or carotid bruit present.  Cardiovascular: RRR, S1 normal, S2 normal, no MRG, pulses symmetric and  intact bilaterally  Pulmonary/Chest: CTAB, no wheezes, rales, or rhonchi  Abdominal: Soft. Non-tender, non-distended, bowel sounds are normal, no masses, organomegaly, or guarding present.  GU: no CVA tenderness Musculoskeletal: No joint deformities, erythema, or stiffness, ROM full and no nontender Ext: no edema and no cyanosis, pulses palpable bilaterally (DP and PT)  Hematology: no cervical, inginal, or axillary adenopathy.  Neurological: A&O x3, Strenght is normal and symmetric bilaterally, cranial nerve II-XII are grossly intact, no focal motor deficit, sensory intact to light touch bilaterally.  Skin: Warm, dry and intact. No rash, cyanosis, or clubbing.  Psychiatric: Normal mood and affect. speech and behavior is normal. Judgment and thought content normal. Cognition and memory are normal.       Labs on Admission:    Basic Metabolic Panel:  Recent Labs Lab 04/19/14 1509  NA 137  K 4.2  CL 102  CO2 21  GLUCOSE 125*  BUN 14  CREATININE 1.18  CALCIUM 9.8   Liver Function Tests:  Recent Labs Lab 04/19/14 1509  AST 20  ALT 11  ALKPHOS 39  BILITOT <0.2*  PROT 7.3  ALBUMIN 4.3   No results found for this basename: LIPASE, AMYLASE,  in the last 168 hours No results found for this basename: AMMONIA,  in the last 168 hours CBC:  Recent Labs Lab 04/19/14 1509  WBC 7.6  HGB 12.7*  HCT 36.8*  MCV 86.4  PLT 179   Cardiac Enzymes: No results found for this basename: CKTOTAL, CKMB, CKMBINDEX, TROPONINI,  in the last 168 hours  BNP (last 3 results) No results found for this basename: PROBNP,  in the last 8760 hours    CBG:  Recent Labs Lab 04/19/14 1524  GLUCAP 120*    Radiological Exams on Admission: Dg Chest 2 View  04/19/2014   CLINICAL DATA:  Altered mental status.  EXAM: CHEST  2 VIEW  COMPARISON:  03/03/2011  FINDINGS: Lungs are adequately inflated without consolidation or effusion. The cardiomediastinal silhouette is within normal. There is  minimal calcified plaque over the aortic arch. Remainder the exam is unremarkable.  IMPRESSION: No active cardiopulmonary disease.   Electronically Signed   By: Elberta Fortis M.D.   On: 04/19/2014 17:18   Ct Head Wo Contrast  04/19/2014   CLINICAL DATA:  Altered level of consciousness  EXAM: CT HEAD WITHOUT CONTRAST  TECHNIQUE: Contiguous axial images were obtained from the base of the skull through the vertex without intravenous contrast.  COMPARISON:  None.  FINDINGS: No acute hemorrhage, infarct, or mass lesion is identified. No midline shift. Ventricles are normal in size. Orbits and paranasal sinuses are unremarkable. No skull fracture. Mild ethmoid mucoperiosteal thickening noted. No skull fracture. Orbits are grossly unremarkable.  IMPRESSION: No acute intracranial finding.   Electronically Signed   By: Christiana Pellant M.D.   On: 04/19/2014 16:59    EKG: Independently reviewed.    Assessment/Plan Active Problems:   TIA (transient ischemic attack)   Altered level of consciousness   Altered mental status Family noted slurred speech at home Differential diagnoses could include a TIA vs seizure No prior history of seizure Will admit for a TIA workup MRI/MRA, carotid Doppler, 2-D echo Hemoglobin A1c, lipid panel PT/OT/speech evaluation Also check an EEG to rule out underlying  seizure  Other check a urine drug screen, EtOH level, vitamin B 12 level, TSH  ruled out for infectious etiology  History of previous rectal bleeding, secondary to hemorrhoids Continue PPI   Code Status:   full Family Communication: bedside Disposition Plan: Being brought in for observation  Time spent: 70 mins   [facility] Pager [phone]  If 7PM-7AM, please contact night-coverage [email or web address] Password [email or web address] 04/19/2014, 5:52 PM

## 2014-04-19 NOTE — ED Provider Notes (Signed)
Medical screening examination/treatment/procedure(s) were conducted as a shared visit with non-physician practitioner(s) and myself.  I personally evaluated the patient during the encounter.   EKG Interpretation   Date/Time:  Saturday April 19 2014 15:03:14 EDT Ventricular Rate:  71 PR Interval:  144 QRS Duration: 80 QT Interval:  356 QTC Calculation: 386 R Axis:   88 Text Interpretation:  Normal sinus rhythm Minimal voltage criteria for  LVH, may be normal variant Septal infarct , age undetermined Abnormal ECG  No significant change since last tracing Confirmed by Jadene Pierini, KRISTEN  [phone]) on 04/19/2014 3:05:53 PM        Austin Ruiz Young Berry III, MD 04/19/14 2251

## 2014-04-20 ENCOUNTER — Observation Stay (HOSPITAL_COMMUNITY): Payer: Medicaid Other

## 2014-04-20 DIAGNOSIS — G459 Transient cerebral ischemic attack, unspecified: Secondary | ICD-10-CM

## 2014-04-20 DIAGNOSIS — R4182 Altered mental status, unspecified: Secondary | ICD-10-CM | POA: Diagnosis present

## 2014-04-20 LAB — LIPID PANEL
Cholesterol: 197 mg/dL (ref 0–200)
HDL: 44 mg/dL (ref >39)
LDL Cholesterol: 127 mg/dL — ABNORMAL HIGH (ref 0–99)
Total CHOL/HDL Ratio: 4.5 RATIO
Triglycerides: 131 mg/dL (ref <150)
VLDL: 26 mg/dL (ref 0–40)

## 2014-04-20 LAB — COMPREHENSIVE METABOLIC PANEL
ALK PHOS: 33 U/L — AB (ref 39–117)
ALT: 9 U/L (ref 0–53)
AST: 18 U/L (ref 0–37)
Albumin: 3.8 g/dL (ref 3.5–5.2)
Anion gap: 10 (ref 5–15)
BUN: 12 mg/dL (ref 6–23)
CO2: 25 meq/L (ref 19–32)
Calcium: 9.6 mg/dL (ref 8.4–10.5)
Chloride: 105 mEq/L (ref 96–112)
Creatinine, Ser: 1.18 mg/dL (ref 0.50–1.35)
GFR, EST AFRICAN AMERICAN: 72 mL/min — AB (ref >90)
GFR, EST NON AFRICAN AMERICAN: 62 mL/min — AB (ref >90)
GLUCOSE: 107 mg/dL — AB (ref 70–99)
POTASSIUM: 4.1 meq/L (ref 3.7–5.3)
SODIUM: 140 meq/L (ref 137–147)
TOTAL PROTEIN: 6.7 g/dL (ref 6.0–8.3)
Total Bilirubin: 0.2 mg/dL — ABNORMAL LOW (ref 0.3–1.2)

## 2014-04-20 LAB — VITAMIN B12: VITAMIN B 12: 519 pg/mL (ref 211–911)

## 2014-04-20 LAB — HEMOGLOBIN A1C
Hgb A1c MFr Bld: 6 % — ABNORMAL HIGH (ref <5.7)
Mean Plasma Glucose: 126 mg/dL — ABNORMAL HIGH (ref <117)

## 2014-04-20 MED ORDER — PANTOPRAZOLE SODIUM 40 MG PO TBEC
40.0000 mg | DELAYED_RELEASE_TABLET | Freq: Every day | ORAL | Status: DC
  Administered 2014-04-20 – 2014-04-21 (×2): 40 mg via ORAL
  Filled 2014-04-20 (×2): qty 1

## 2014-04-20 MED ORDER — ONDANSETRON HCL 4 MG/2ML IJ SOLN: 4.0000 mg | Freq: Three times a day (TID) | INTRAMUSCULAR | Status: DC | PRN

## 2014-04-20 MED ORDER — CYANOCOBALAMIN 1000 MCG/ML IJ SOLN: 1000.0000 ug | INTRAMUSCULAR | Status: DC

## 2014-04-20 NOTE — Progress Notes (Signed)
VASCULAR LAB PRELIMINARY  PRELIMINARY  PRELIMINARY  PRELIMINARY  Carotid Dopplers completed.    Preliminary report:  1-39% ICA stenosis.  Vertebral artery flow is antegrade.   Calandra Madura, RVT 04/20/2014, 10:28 AM

## 2014-04-20 NOTE — Progress Notes (Signed)
Pharmacy was notified of medications patient has been taking at home that were not included on admission.  MD was notified as well.  Will continue to monitor patient.

## 2014-04-20 NOTE — Progress Notes (Signed)
Echo Lab  2D Echocardiogram completed.  Jaculin Rasmus L Sheriff Rodenberg, RDCS 04/20/2014 10:35 AM

## 2014-04-20 NOTE — Progress Notes (Addendum)
[facility] PROGRESS NOTE  Austin Thornton [id number] DOB: Jan 07, 1946 DOA: 04/19/2014 PCP: Lonia Blood, MD  Assessment/Plan: Active Problems:   TIA (transient ischemic attack)   Altered level of consciousness    Altered mental status no more recurrence of his symptoms  Family noted slurred speech at home  Differential diagnoses could include a TIA vs seizure vs secondary to his muscle relaxant, narcotics Continue aspirin No prior history of seizure  Will admit for a TIA workup  MRI/MRA pending,  2-D echo pending  Carotid Doppler 1-39% ICA stenosis Hemoglobin A1c pending,  lipid panel shows LDL to be 127 PT/OT/speech evaluation  Will discontinue EEG due to low suspicion that the patient had a seizure Other check a urine drug screen, EtOH level less than 11, vitamin B 12 level, TSH normal ruled out for infectious etiology    History of previous rectal bleeding, secondary to hemorrhoids  Continue PPI    Code Status: full Family Communication: family updated about patient's clinical progress Disposition Plan:  Anticipate discharge home tomorrow, workup pending   Brief narrative: 68 year old male from [place], with no significant past medical history except for lower back pain, previous history of rectal bleeding, vitamin B12 deficiency brought in by his son because of confusion and altered mental status starting late last night, lasting for about 4-5 hours. History was obtained from the son acting as an interpreter.Per son, patient was awake last night with abnormal behavior, reading and praying, repetitively going to the bathroom, with 'thick' speech and inappropriate answers to questions. He was using appropriate words. No reported facial droop, weakness/numbness in extremities, imbalance/trouble walking. No diplopia, slurred speech, nausea vomiting. He did complain of some mild central chest pain.No h/o: previous CVA, HTN, high cholesterol, DM, family history of CVA.  Symptoms are now resolved and patient is at baseline.  CT of the head negative, chest x-ray negative, urine negative  Apparently has not seen a PCP since 2012   Consultants:  None  Procedures: None  Antibiotics:  None  HPI/Subjective: Patient's daughter is by the bedside working as an interpreter for the patient Overall he feels better, no complaints, is alert and oriented  Objective: Filed Vitals:   04/19/14 1817 04/19/14 1830 04/19/14 1920 04/20/14 1006  BP:  157/85 160/90 128/75  Pulse:  61 66 69  Temp: 98.1 F (36.7 C)  98.1 F (36.7 C) 98.2 F (36.8 C)  TempSrc:   Oral Oral  Resp:  SpO2:  100% 100% 99%    Intake/Output Summary (Last 24 hours) at 04/20/14 1035 Last data filed at 04/20/14 0900  Gross per 24 hour  Intake    240 ml  Output      0 ml  Net    240 ml    Exam:  General: alert & oriented x 3 In NAD  Cardiovascular: RRR, nl S1 s2  Respiratory: Decreased breath sounds at the bases, scattered rhonchi, no crackles  Abdomen: soft +BS NT/ND, no masses palpable  Extremities: No cyanosis and no edema      Data Reviewed: Basic Metabolic Panel:  Recent Labs Lab 04/19/14 1509 04/19/14 1751 04/20/14 0429  NA 137  --  140  K 4.2  --  4.1  CL 102  --  105  CO2 21  --  25  GLUCOSE 125*  --  107*  BUN 14  --  12  CREATININE 1.18 1.24 1.18  CALCIUM 9.8  --  9.6    Liver Function Tests:  Recent Labs Lab 04/19/14 1509 04/20/14 0429  AST 20 18  ALT 11 9  ALKPHOS 39 33*  BILITOT <0.2* 0.2*  PROT 7.3 6.7  ALBUMIN 4.3 3.8   No results found for this basename: LIPASE, AMYLASE,  in the last 168 hours No results found for this basename: AMMONIA,  in the last 168 hours  CBC:  Recent Labs Lab 04/19/14 1509 04/19/14 1751  WBC 7.6 6.7  HGB 12.7* 12.4*  HCT 36.8* 36.9*  MCV 86.4 86.6  PLT 179 166    Cardiac Enzymes: No results found for this basename: CKTOTAL, CKMB, CKMBINDEX, TROPONINI,  in the last 168 hours BNP (last 3  results) No results found for this basename: PROBNP,  in the last 8760 hours   CBG:  Recent Labs Lab 04/19/14 1524  GLUCAP 120*    No results found for this or any previous visit (from the past 240 hour(s)).   Studies: Dg Chest 2 View  04/19/2014   CLINICAL DATA:  Altered mental status.  EXAM: CHEST  2 VIEW  COMPARISON:  03/03/2011  FINDINGS: Lungs are adequately inflated without consolidation or effusion. The cardiomediastinal silhouette is within normal. There is minimal calcified plaque over the aortic arch. Remainder the exam is unremarkable.  IMPRESSION: No active cardiopulmonary disease.   Electronically Signed   By: Elberta Fortis M.D.   On: 04/19/2014 17:18   Ct Head Wo Contrast  04/19/2014   CLINICAL DATA:  Altered level of consciousness  EXAM: CT HEAD WITHOUT CONTRAST  TECHNIQUE: Contiguous axial images were obtained from the base of the skull through the vertex without intravenous contrast.  COMPARISON:  None.  FINDINGS: No acute hemorrhage, infarct, or mass lesion is identified. No midline shift. Ventricles are normal in size. Orbits and paranasal sinuses are unremarkable. No skull fracture. Mild ethmoid mucoperiosteal thickening noted. No skull fracture. Orbits are grossly unremarkable.  IMPRESSION: No acute intracranial finding.   Electronically Signed   By: Christiana Pellant M.D.   On: 04/19/2014 16:59    Scheduled Meds: . aspirin  300 mg Rectal Daily   Or  . aspirin  325 mg Oral Daily  . [START ON 05/15/2014] cyanocobalamin  1,000 mcg Intramuscular Q30 days  . enoxaparin (LOVENOX) injection  40 mg Subcutaneous Q24H  . pantoprazole  40 mg Oral Daily   Continuous Infusions:   Active Problems:   TIA (transient ischemic attack)   Altered level of consciousness    Time spent: 40 minutes   [facility]  [facility] Pager [phone]. If 7PM-7AM, please contact night-coverage at [email or web address], password [email or web address] 04/20/2014, 10:35 AM  LOS: 1 day

## 2014-04-20 NOTE — Progress Notes (Signed)
UR Completed.  Aashish Hamm Jane [phone] 04/20/2014  

## 2014-04-20 NOTE — Evaluation (Signed)
Physical Therapy Evaluation Patient Details Name: Austin Thornton MRN: [id number] DOB: [birth year] Today's Date: 04/20/2014   History of Present Illness  68 y.o. male (from [place], but lives now in [place] with family, does not speak English) admitted to [facility] on 04/19/14 with AMS, changes in his speech.  Stroke workup being completed. CT scan negative for acute infarct.  MRI pending.  Pt with significant PMHx of UTI and anemia.    Clinical Impression  Pt presents with mildly unsteady gait pattern different than usual per his son's report.  He does not need much external assist (min guard for gait, supervision on stairs) and would benefit from being followed by PT acutely.  No follow up PT recommended at this time.      Follow Up Recommendations No PT follow up;Supervision for mobility/OOB    Equipment Recommendations  None recommended by PT    Recommendations for Other Services   NA     Precautions / Restrictions Precautions Precautions: Fall Precaution Comments: mildly staggering gait pattern      Mobility  Bed Mobility Overal bed mobility: Independent                Transfers Overall transfer level: Needs assistance Equipment used: None Transfers: Sit to/from Stand Sit to Stand: Supervision         General transfer comment: supervision for safety  Ambulation/Gait Ambulation/Gait assistance: Min guard Ambulation Distance (Feet): 200 Feet Assistive device: None Gait Pattern/deviations: Step-through pattern;Staggering left;Staggering right Gait velocity: decreased Gait velocity interpretation: Below normal speed for age/gender (per son, below his normal speed) General Gait Details: Pt with mildly staggering gait pattern, increases with head turns and multi tasking.  Per son, he did not stagger PTA  Stairs Stairs: Yes Stairs assistance: Supervision Stair Management: One rail Left;Alternating pattern;Forwards Number of Stairs: 5 General stair comments: Pt was  able to do stairs safely with the use of the railing and supervision.  It would be beneficial to assess him on the stairwell(more stairs and consistantly steeper).          Balance Overall balance assessment: Needs assistance Sitting-balance support: Feet supported;No upper extremity supported Sitting balance-Leahy Scale: Good     Standing balance support: No upper extremity supported Standing balance-Leahy Scale: Good Standing balance comment: some deficits noted with gait.                             Pertinent Vitals/Pain Pain Assessment: No/denies pain    Home Living Family/patient expects to be discharged to:: Private residence Living Arrangements: Children Available Help at Discharge: Family;Available 24 hours/day Type of Home: House Home Access: Level entry     Home Layout: Two level;Bed/bath upstairs Home Equipment: None      Prior Function Level of Independence: Independent         Comments: independent in home mobility        Extremity/Trunk Assessment   Upper Extremity Assessment: Defer to OT evaluation           Lower Extremity Assessment: Overall WFL for tasks assessed (seated MMT 5/5 bil, reports sore calves bil)      Cervical / Trunk Assessment: Normal  Communication   Communication: Prefers language other than English  Cognition Arousal/Alertness: Awake/alert Behavior During Therapy: WFL for tasks assessed/performed Overall Cognitive Status: Difficult to assess (son reports he is near baseline cognition)  Assessment/Plan    PT Assessment Patient needs continued PT services  PT Diagnosis Difficulty walking;Abnormality of gait;Altered mental status   PT Problem List Decreased balance;Decreased mobility;Decreased coordination;Decreased knowledge of precautions  PT Treatment Interventions DME instruction;Gait training;Stair training;Functional mobility training;Therapeutic  activities;Therapeutic exercise;Cognitive remediation;Neuromuscular re-education;Balance training;Patient/family education   PT Goals (Current goals can be found in the Care Plan section) Acute Rehab PT Goals Patient Stated Goal: to go home PT Goal Formulation: With patient/family Time For Goal Achievement: 05/04/14 Potential to Achieve Goals: Good    Frequency Min 4X/week    End of Session Equipment Utilized During Treatment: Gait belt Activity Tolerance: Patient tolerated treatment well Patient left: in chair;with call bell/phone within reach;with family/visitor present (son in room)      Functional Assessment Tool Used: assist level Functional Limitation: Mobility: Walking and moving around Mobility: Walking and Moving Around Current Status [phone]): At least 1 percent but less than 20 percent impaired, limited or restricted Mobility: Walking and Moving Around Goal Status [phone]): 0 percent impaired, limited or restricted    Time: 1430-1441 PT Time Calculation (min): 11 min   Charges:   PT Evaluation $Initial PT Evaluation Tier I: 1 Procedure     PT G Codes:   Functional Assessment Tool Used: assist level Functional Limitation: Mobility: Walking and moving around    Peralta B. Gaberiel Youngblood, PT, DPT [phone]   04/20/2014, 3:01 PM

## 2014-04-21 LAB — RAPID URINE DRUG SCREEN, HOSP PERFORMED
Amphetamines: NOT DETECTED
Barbiturates: NOT DETECTED
Benzodiazepines: NOT DETECTED
Cocaine: NOT DETECTED
Opiates: NOT DETECTED
Tetrahydrocannabinol: NOT DETECTED

## 2014-04-21 NOTE — Progress Notes (Signed)
1230 - discharge instructions given to pt and pt's family members who were present at bedside. Nepali interpreter, [place] [phone]. Pt verbally acknowledged understanding. Pt voiced no questions when prompted.  No iv access. Pt to be transported to lobby per wheelchair by nurse. Family member to transport pt to home by private car Andrew Au I 04/21/2014 12:48 PM

## 2014-04-21 NOTE — Evaluation (Addendum)
Occupational Therapy Evaluation Patient Details Name: Austin Thornton MRN: [id number] DOB: Oct 13, 1945 Today's Date: 04/21/2014    History of Present Illness 68 y.o. male (from [place], but lives now in [place] with family, does not speak English) admitted to [facility] on 04/19/14 with AMS, changes in his speech.  Stroke workup being completed. CT scan negative for acute infarct.  MRI pending.  Pt with significant PMHx of UTI and anemia.     Clinical Impression   Pt admitted with above. Education provided to pt and family. No further OT needs.     Follow Up Recommendations  No OT follow up;Supervision - Intermittent    Equipment Recommendations  None recommended by OT    Recommendations for Other Services       Precautions / Restrictions Precautions Precautions: Fall Precaution Comments: mildly staggering gait pattern      Mobility Bed Mobility Overal bed mobility: Independent                Transfers Overall transfer level: Needs assistance Equipment used: None Transfers: Sit to/from Stand Sit to Stand: Supervision         General transfer comment: supervision for safety    Balance                                            ADL Overall ADL's : Needs assistance/impaired     Grooming: Oral care;Set up;Supervision/safety;Standing               Lower Body Dressing: Supervision/safety;Sit to/from stand;Set up   Toilet Transfer: Min guard;Ambulation;Regular Toilet;Grab bars       Tub/ Shower Transfer: Min guard;Ambulation;Shower seat   Functional mobility during ADLs: Min guard General ADL Comments: Educated on safety tips for home (safe shoewear, sitting for most of LB ADLs). Recommended someone being with pt for tub transfer. Practiced safe technique. Recommended someone be with pt while he is up and moving around. Educated on signs/symptoms of stroke and importance of getting help right away. Ambulated in hallway. Explained how pt could  use long handled sponge to help wash back (daughter states he gets assistance with this)     Vision                     Perception     Praxis      Pertinent Vitals/Pain Pain Assessment: No/denies pain     Hand Dominance     Extremity/Trunk Assessment Upper Extremity Assessment Upper Extremity Assessment: Overall WFL for tasks assessed   Lower Extremity Assessment Lower Extremity Assessment: Defer to PT evaluation   Cervical / Trunk Assessment Cervical / Trunk Assessment: Normal   Communication Communication Communication: Prefers language other than English   Cognition Arousal/Alertness: Awake/alert Behavior During Therapy: WFL for tasks assessed/performed Overall Cognitive Status: Difficult to assess due to language barrier                     General Comments       Exercises       Shoulder Instructions      Home Living Family/patient expects to be discharged to:: Private residence Living Arrangements: Children Available Help at Discharge: Family;Available 24 hours/day Type of Home: House Home Access: Level entry     Home Layout: Two level;Bed/bath upstairs Alternate Level Stairs-Number of Steps: flight Alternate Level Stairs-Rails: Left Bathroom Shower/Tub: Tub/shower unit  Bathroom Toilet: Standard     Home Equipment:  (have a chair he uses in shower)          Prior Functioning/Environment Level of Independence: Independent        Comments: independent in home mobility    OT Diagnosis:     OT Problem List:     OT Treatment/Interventions:      OT Goals(Current goals can be found in the care plan section)    OT Frequency:     Barriers to D/C:            Co-evaluation              End of Session Equipment Utilized During Treatment: Gait belt Nurse Communication: Mobility status  Activity Tolerance: Patient tolerated treatment well Patient left: in bed;with call bell/phone within reach;with family/visitor  present   Time: 1610-9604 OT Time Calculation (min): 21 min Charges:  OT General Charges $OT Visit: 1 Procedure OT Evaluation $Initial OT Evaluation Tier I: 1 Procedure OT Treatments $Self Care/Home Management : 8-22 mins G-Codes: OT G-codes **NOT FOR INPATIENT CLASS** Functional Assessment Tool Used: clinical judgment Functional Limitation: Self care Self Care Current Status (V4098): At least 1 percent but less than 20 percent impaired, limited or restricted Self Care Goal Status (J1914): At least 1 percent but less than 20 percent impaired, limited or restricted Self Care Discharge Status [phone]): At least 1 percent but less than 20 percent impaired, limited or restricted  Earlie Raveling OTR/L [phone] 04/21/2014, 12:24 PM

## 2014-04-21 NOTE — Progress Notes (Signed)
0830 - pt is non-English speaking. Speaks Nepali only. Nepali interpreter, Karren Burly [phone], accessed via telephone for interpretation of assessment, meds, safety precautions, call light, and to contact staff for assistance. Pt verbally acknowledged understanding understanding Austin Thornton I  04/21/2014 11:43 AM

## 2014-04-21 NOTE — Progress Notes (Signed)
UR complete.  Sriyan Cutting RN, MSN 

## 2014-04-21 NOTE — Discharge Summary (Addendum)
Physician Discharge Summary  Trustin Chapa MRN: [id number] DOB/AGE: [birth year] 68 y.o.  PCP: Barbette Merino, MD   Admit date: 04/19/2014 Discharge date: 04/21/2014  Discharge Diagnoses:    Transient altered mental status secondary to medications Ruled out for TIA (transient ischemic attack) Chronic low back pain Prediabetes  Follow up with PCP in 5-7 days Recommend hemoglobin A1c in 3 months    Medication List    STOP taking these medications       cyclobenzaprine 5 MG tablet  Commonly known as:  FLEXERIL      TAKE these medications       acetaminophen 325 MG tablet  Commonly known as:  TYLENOL  Take 650 mg by mouth every 6 (six) hours as needed for mild pain.     cyanocobalamin 1000 MCG/ML injection  Commonly known as:  (VITAMIN B-12)  Inject 1,000 mcg into the muscle every 30 (thirty) days.     megestrol 40 MG tablet  Commonly known as:  MEGACE  Take 40 mg by mouth daily.     megestrol 40 MG/ML suspension  Commonly known as:  MEGACE  Take 400 mg by mouth daily.     naproxen 500 MG tablet  Commonly known as:  NAPROSYN  Take 500 mg by mouth 2 (two) times daily with a meal.     pantoprazole 40 MG tablet  Commonly known as:  PROTONIX  Take 40 mg by mouth daily.     traMADol 50 MG tablet  Commonly known as:  ULTRAM  Take 50 mg by mouth 2 (two) times daily.        Discharge Condition: Stable Disposition: 01-Home or Self Care   Consults:  None  Significant Diagnostic Studies: Dg Chest 2 View  04/19/2014   CLINICAL DATA:  Altered mental status.  EXAM: CHEST  2 VIEW  COMPARISON:  03/03/2011  FINDINGS: Lungs are adequately inflated without consolidation or effusion. The cardiomediastinal silhouette is within normal. There is minimal calcified plaque over the aortic arch. Remainder the exam is unremarkable.  IMPRESSION: No active cardiopulmonary disease.   Electronically Signed   By: Marin Olp M.D.   On: 04/19/2014 17:18   Ct Head Wo  Contrast  04/19/2014   CLINICAL DATA:  Altered level of consciousness  EXAM: CT HEAD WITHOUT CONTRAST  TECHNIQUE: Contiguous axial images were obtained from the base of the skull through the vertex without intravenous contrast.  COMPARISON:  None.  FINDINGS: No acute hemorrhage, infarct, or mass lesion is identified. No midline shift. Ventricles are normal in size. Orbits and paranasal sinuses are unremarkable. No skull fracture. Mild ethmoid mucoperiosteal thickening noted. No skull fracture. Orbits are grossly unremarkable.  IMPRESSION: No acute intracranial finding.   Electronically Signed   By: Conchita Paris M.D.   On: 04/19/2014 16:59   Mr Brain Wo Contrast  04/20/2014   CLINICAL DATA:  68 year old male with history of vitamin B12 deficiency. Presenting with confusion/ altered mental status for 4 - 5 hr. Abnormal speech.  EXAM: MRI HEAD WITHOUT CONTRAST  MRA HEAD WITHOUT CONTRAST  TECHNIQUE: Multiplanar, multiecho pulse sequences of the brain and surrounding structures were obtained without intravenous contrast. Angiographic images of the head were obtained using MRA technique without contrast.  COMPARISON:  04/19/2014 CT.  No comparison MR MR.  FINDINGS: MRI HEAD FINDINGS  Coronal T2 imaging not performed.  No acute infarct.  No intracranial hemorrhage.  No intracranial mass lesion noted on this unenhanced exam.  No altered signal intensity of  the basal ganglia as has been described in patients with vitamin B12 deficiency. There is however, subtle peri aqueductal increased signal. This has been described with thiamine (B1) deficiency.  Minimal white matter type changes may reflect result of small vessel disease.  Global mild atrophy without hydrocephalus.  Small pituitary gland possibly congenitally small.  Cervical medullary junction, pineal region and orbital structures unremarkable.  Partial opacification posterior right ethmoid sinus air cells.  MRA HEAD FINDINGS  Exam is motion degraded.  Anterior  circulation without medium or large size vessel significant stenosis or occlusion.  Ectatic vertebral arteries and basilar artery without high-grade stenosis. Artifact mid aspect of the basilar artery.  Non visualized right posterior inferior cerebellar artery and both anterior inferior cerebellar arteries.  Attenuated irregular superior cerebellar artery bilaterally.  Mild irregularity of the posterior cerebral artery bilaterally without high-grade stenosis.  No aneurysm noted.  IMPRESSION: MRI HEAD:  No acute infarct.  No altered signal intensity of the basal ganglia as has been described in patients with vitamin B12 deficiency. There is however, subtle peri aqueductal increased signal. This has been described with thiamine (B1) deficiency.  No intracranial mass lesion noted on this unenhanced exam.  Minimal white matter type changes may reflect result of small vessel disease.  Global mild atrophy without hydrocephalus.  Small pituitary gland possibly congenitally small.  Partial opacification posterior right ethmoid sinus air cells.  MRA HEAD FINDINGS  No medium or large size vessel significant stenosis or occlusion   Electronically Signed   By: Chauncey Cruel M.D.   On: 04/20/2014 20:03   Mr Jodene Nam Head/brain Wo Cm  04/20/2014   CLINICAL DATA:  68 year old male with history of vitamin B12 deficiency. Presenting with confusion/ altered mental status for 4 - 5 hr. Abnormal speech.  EXAM: MRI HEAD WITHOUT CONTRAST  MRA HEAD WITHOUT CONTRAST  TECHNIQUE: Multiplanar, multiecho pulse sequences of the brain and surrounding structures were obtained without intravenous contrast. Angiographic images of the head were obtained using MRA technique without contrast.  COMPARISON:  04/19/2014 CT.  No comparison MR MR.  FINDINGS: MRI HEAD FINDINGS  Coronal T2 imaging not performed.  No acute infarct.  No intracranial hemorrhage.  No intracranial mass lesion noted on this unenhanced exam.  No altered signal intensity of the basal  ganglia as has been described in patients with vitamin B12 deficiency. There is however, subtle peri aqueductal increased signal. This has been described with thiamine (B1) deficiency.  Minimal white matter type changes may reflect result of small vessel disease.  Global mild atrophy without hydrocephalus.  Small pituitary gland possibly congenitally small.  Cervical medullary junction, pineal region and orbital structures unremarkable.  Partial opacification posterior right ethmoid sinus air cells.  MRA HEAD FINDINGS  Exam is motion degraded.  Anterior circulation without medium or large size vessel significant stenosis or occlusion.  Ectatic vertebral arteries and basilar artery without high-grade stenosis. Artifact mid aspect of the basilar artery.  Non visualized right posterior inferior cerebellar artery and both anterior inferior cerebellar arteries.  Attenuated irregular superior cerebellar artery bilaterally.  Mild irregularity of the posterior cerebral artery bilaterally without high-grade stenosis.  No aneurysm noted.  IMPRESSION: MRI HEAD:  No acute infarct.  No altered signal intensity of the basal ganglia as has been described in patients with vitamin B12 deficiency. There is however, subtle peri aqueductal increased signal. This has been described with thiamine (B1) deficiency.  No intracranial mass lesion noted on this unenhanced exam.  Minimal white matter type changes  may reflect result of small vessel disease.  Global mild atrophy without hydrocephalus.  Small pituitary gland possibly congenitally small.  Partial opacification posterior right ethmoid sinus air cells.  MRA HEAD FINDINGS  No medium or large size vessel significant stenosis or occlusion   Electronically Signed   By: Chauncey Cruel M.D.   On: 04/20/2014 20:03      Microbiology: No results found for this or any previous visit (from the past 240 hour(s)).   Labs: Results for orders placed during the hospital encounter of 04/19/14  (from the past 48 hour(s))  CBC     Status: Abnormal   Collection Time    04/19/14  3:09 PM      Result Value Ref Range   WBC 7.6  4.0 - 10.5 K/uL   RBC 4.26  4.22 - 5.81 MIL/uL   Hemoglobin 12.7 (*) 13.0 - 17.0 g/dL   HCT 36.8 (*) 39.0 - 52.0 %   MCV 86.4  78.0 - 100.0 fL   MCH 29.8  26.0 - 34.0 pg   MCHC 34.5  30.0 - 36.0 g/dL   RDW 14.0  11.5 - 15.5 %   Platelets 179  150 - 400 K/uL  COMPREHENSIVE METABOLIC PANEL     Status: Abnormal   Collection Time    04/19/14  3:09 PM      Result Value Ref Range   Sodium 137  137 - 147 mEq/L   Potassium 4.2  3.7 - 5.3 mEq/L   Chloride 102  96 - 112 mEq/L   CO2 21  19 - 32 mEq/L   Glucose, Bld 125 (*) 70 - 99 mg/dL   BUN 14  6 - 23 mg/dL   Creatinine, Ser 1.18  0.50 - 1.35 mg/dL   Calcium 9.8  8.4 - 10.5 mg/dL   Total Protein 7.3  6.0 - 8.3 g/dL   Albumin 4.3  3.5 - 5.2 g/dL   AST 20  0 - 37 U/L   Comment: HEMOLYSIS AT THIS LEVEL MAY AFFECT RESULT   ALT 11  0 - 53 U/L   Alkaline Phosphatase 39  39 - 117 U/L   Total Bilirubin <0.2 (*) 0.3 - 1.2 mg/dL   GFR calc non Af Amer 62 (*) >90 mL/min   GFR calc Af Amer 72 (*) >90 mL/min   Comment: (NOTE)     The eGFR has been calculated using the CKD EPI equation.     This calculation has not been validated in all clinical situations.     eGFR's persistently <90 mL/min signify possible Chronic Kidney     Disease.   Anion gap 14  5 - 15  I-STAT TROPOININ, ED     Status: None   Collection Time    04/19/14  3:15 PM      Result Value Ref Range   Troponin i, poc 0.01  0.00 - 0.08 ng/mL   Comment 3            Comment: Due to the release kinetics of cTnI,     a negative result within the first hours     of the onset of symptoms does not rule out     myocardial infarction with certainty.     If myocardial infarction is still suspected,     repeat the test at appropriate intervals.  CBG MONITORING, ED     Status: Abnormal   Collection Time    04/19/14  3:24 PM  Result Value Ref Range    Glucose-Capillary 120 (*) 70 - 99 mg/dL  URINALYSIS, ROUTINE W REFLEX MICROSCOPIC     Status: None   Collection Time    04/19/14  4:58 PM      Result Value Ref Range   Color, Urine YELLOW  YELLOW   APPearance CLEAR  CLEAR   Specific Gravity, Urine 1.014  1.005 - 1.030   pH 5.5  5.0 - 8.0   Glucose, UA NEGATIVE  NEGATIVE mg/dL   Hgb urine dipstick NEGATIVE  NEGATIVE   Bilirubin Urine NEGATIVE  NEGATIVE   Ketones, ur NEGATIVE  NEGATIVE mg/dL   Protein, ur NEGATIVE  NEGATIVE mg/dL   Urobilinogen, UA 0.2  0.0 - 1.0 mg/dL   Nitrite NEGATIVE  NEGATIVE   Leukocytes, UA NEGATIVE  NEGATIVE   Comment: MICROSCOPIC NOT DONE ON URINES WITH NEGATIVE PROTEIN, BLOOD, LEUKOCYTES, NITRITE, OR GLUCOSE <1000 mg/dL.  CBC     Status: Abnormal   Collection Time    04/19/14  5:51 PM      Result Value Ref Range   WBC 6.7  4.0 - 10.5 K/uL   RBC 4.26  4.22 - 5.81 MIL/uL   Hemoglobin 12.4 (*) 13.0 - 17.0 g/dL   HCT 36.9 (*) 39.0 - 52.0 %   MCV 86.6  78.0 - 100.0 fL   MCH 29.1  26.0 - 34.0 pg   MCHC 33.6  30.0 - 36.0 g/dL   RDW 14.2  11.5 - 15.5 %   Platelets 166  150 - 400 K/uL  CREATININE, SERUM     Status: Abnormal   Collection Time    04/19/14  5:51 PM      Result Value Ref Range   Creatinine, Ser 1.24  0.50 - 1.35 mg/dL   GFR calc non Af Amer 58 (*) >90 mL/min   GFR calc Af Amer 68 (*) >90 mL/min   Comment: (NOTE)     The eGFR has been calculated using the CKD EPI equation.     This calculation has not been validated in all clinical situations.     eGFR's persistently <90 mL/min signify possible Chronic Kidney     Disease.  VITAMIN B12     Status: None   Collection Time    04/19/14  5:51 PM      Result Value Ref Range   Vitamin B-12 519  211 - 911 pg/mL   Comment: Performed at [facility]     Status: None   Collection Time    04/19/14  5:51 PM      Result Value Ref Range   Alcohol, Ethyl (B) <11  0 - 11 mg/dL   Comment:            LOWEST DETECTABLE LIMIT FOR      SERUM ALCOHOL IS 11 mg/dL     FOR MEDICAL PURPOSES ONLY  TSH     Status: None   Collection Time    04/19/14  5:54 PM      Result Value Ref Range   TSH 0.873  0.350 - 4.500 uIU/mL  HEMOGLOBIN A1C     Status: Abnormal   Collection Time    04/20/14  4:29 AM      Result Value Ref Range   Hemoglobin A1C 6.0 (*) <5.7 %   Comment: (NOTE)  According to the ADA Clinical Practice Recommendations for 2011, when     HbA1c is used as a screening test:      >=6.5%   Diagnostic of Diabetes Mellitus               (if abnormal result is confirmed)     5.7-6.4%   Increased risk of developing Diabetes Mellitus     References:Diagnosis and Classification of Diabetes Mellitus,Diabetes     WEXH,3716,96(VELFY 1):S62-S69 and Standards of Medical Care in             Diabetes - 2011,Diabetes BOFB,5102,58 (Suppl 1):S11-S61.   Mean Plasma Glucose 126 (*) <117 mg/dL   Comment: Performed at [facility]     Status: Abnormal   Collection Time    04/20/14  4:29 AM      Result Value Ref Range   Sodium 140  137 - 147 mEq/L   Potassium 4.1  3.7 - 5.3 mEq/L   Chloride 105  96 - 112 mEq/L   CO2 25  19 - 32 mEq/L   Glucose, Bld 107 (*) 70 - 99 mg/dL   BUN 12  6 - 23 mg/dL   Creatinine, Ser 1.18  0.50 - 1.35 mg/dL   Calcium 9.6  8.4 - 10.5 mg/dL   Total Protein 6.7  6.0 - 8.3 g/dL   Albumin 3.8  3.5 - 5.2 g/dL   AST 18  0 - 37 U/L   ALT 9  0 - 53 U/L   Alkaline Phosphatase 33 (*) 39 - 117 U/L   Total Bilirubin 0.2 (*) 0.3 - 1.2 mg/dL   GFR calc non Af Amer 62 (*) >90 mL/min   GFR calc Af Amer 72 (*) >90 mL/min   Comment: (NOTE)     The eGFR has been calculated using the CKD EPI equation.     This calculation has not been validated in all clinical situations.     eGFR's persistently <90 mL/min signify possible Chronic Kidney     Disease.   Anion gap 10  5 - 15  LIPID PANEL     Status:  Abnormal   Collection Time    04/20/14  4:29 AM      Result Value Ref Range   Cholesterol 197  0 - 200 mg/dL   Triglycerides 131  <150 mg/dL   HDL 44  >39 mg/dL   Total CHOL/HDL Ratio 4.5     VLDL 26  0 - 40 mg/dL   LDL Cholesterol 127 (*) 0 - 99 mg/dL   Comment:            Total Cholesterol/HDL:CHD Risk     Coronary Heart Disease Risk Table                         Men   Women      1/2 Average Risk   3.4   3.3      Average Risk       5.0   4.4      2 X Average Risk   9.6   7.1      3 X Average Risk  23.4   11.0                Use the calculated Patient Ratio     above and the CHD Risk Table     to determine the patient's CHD Risk.  ATP III CLASSIFICATION (LDL):      <100     mg/dL   Optimal      100-129  mg/dL   Near or Above                        Optimal      130-159  mg/dL   Borderline      160-189  mg/dL   High      >190     mg/dL   Very High  URINE RAPID DRUG SCREEN (HOSP PERFORMED)     Status: None   Collection Time    04/21/14  7:08 AM      Result Value Ref Range   Opiates NONE DETECTED  NONE DETECTED   Cocaine NONE DETECTED  NONE DETECTED   Benzodiazepines NONE DETECTED  NONE DETECTED   Amphetamines NONE DETECTED  NONE DETECTED   Tetrahydrocannabinol NONE DETECTED  NONE DETECTED   Barbiturates NONE DETECTED  NONE DETECTED   Comment:            DRUG SCREEN FOR MEDICAL PURPOSES     ONLY.  IF CONFIRMATION IS NEEDED     FOR ANY PURPOSE, NOTIFY LAB     WITHIN 5 DAYS.                LOWEST DETECTABLE LIMITS     FOR URINE DRUG SCREEN     Drug Class       Cutoff (ng/mL)     Amphetamine      1000     Barbiturate      200     Benzodiazepine   342     Tricyclics       876     Opiates          300     Cocaine          300     THC              50     HPI : 68 year old male from [place], with no significant past medical history except for lower back pain, previous history of rectal bleeding, vitamin B12 deficiency brought in by his son because of  confusion and altered mental status starting late last night, lasting for about 4-5 hours. History was obtained from the son acting as an interpreter.Per son, patient was awake last night with abnormal behavior, reading and praying, repetitively going to the bathroom, with 'thick' speech and inappropriate answers to questions. He was using appropriate words. No reported facial droop, weakness/numbness in extremities, imbalance/trouble walking. No diplopia, slurred speech, nausea vomiting. He did complain of some mild central chest pain.No h/o: previous CVA, HTN, high cholesterol, DM, family history of CVA. Symptoms are now resolved and patient is at baseline.  CT of the head negative, chest x-ray negative, urine negative  Apparently has not seen a PCP since 2012   HOSPITAL COURSE:   Altered mental status likely secondary to Flexeril and tramadol  Family noted slurred speech at home , no gross focal neurologic deficits noted upon presentation to ED Differential diagnoses could include a TIA vs seizure vs secondary to his muscle relaxant, narcotics  Patient worked up for the possibility of CVA No prior history of seizure  MRI/MRA negative for acute infarct  2-D echo showed an EF of 60%, no cardiac source of embolus  Carotid Doppler 1-39% ICA stenosis  Hemoglobin A1c 6.0 lipid panel shows LDL to  be 127  PT/OT/speech evaluation no PT recommendations at this time Did not perform EEG due to low suspicion that the patient had a seizure  Urine drug screen was negative,  EtOH level less than 11, vitamin B 12 level within normal limits, TSH normal  ruled out for infectious etiology with negative UA, chest x-ray Patient advised to minimize pain medications including Flexeril and narcotics OK to take Tylenol and tramadol  History of previous rectal bleeding, secondary to hemorrhoids  Continue PPI       Discharge Exam:   Blood pressure 136/82, pulse 65, temperature 98.4 F (36.9 C), temperature  source Oral, resp. rate 18, height 5' (1.524 m), weight 39 kg (85 lb 15.7 oz), SpO2 100.00%. Cardiovascular: RRR, S1 normal, S2 normal, no MRG, pulses symmetric and intact bilaterally  Pulmonary/Chest: CTAB, no wheezes, rales, or rhonchi  Abdominal: Soft. Non-tender, non-distended, bowel sounds are normal, no masses, organomegaly, or guarding present.  GU: no CVA tenderness Musculoskeletal: No joint deformities, erythema, or stiffness, ROM full and no nontender Ext: no edema and no cyanosis, pulses palpable bilaterally (DP and PT)  Hematology: no cervical, inginal, or axillary adenopathy.  Neurological: A&O x3, Strenght is normal and symmetric bilaterally, cranial nerve II-XII are grossly intact         Discharge Instructions   Diet - low sodium heart healthy    Complete by:  As directed      Increase activity slowly    Complete by:  As directed              Signed: Aviella Disbrow 04/21/2014, 10:15 AM

## 2015-11-26 IMAGING — CT CT HEAD W/O CM
2 series · 16 of 30 positions shown, 20 images · non-contrast
Comparison: None.

CLINICAL DATA: Altered level of consciousness

EXAM:
CT HEAD WITHOUT CONTRAST
TECHNIQUE: Contiguous axial images were obtained from the base of the skull
through the vertex without intravenous contrast.

[Series 201: head w/o, idose (1) · axial · non-contrast · 0.49mm/px · z∈[+86,+216]mm · 13 of 32 slices shown, 17 images]
[im 3/32  brain]
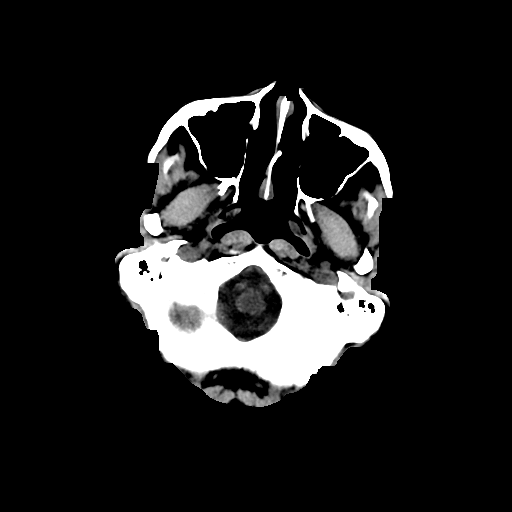
[im 3/32  bone]
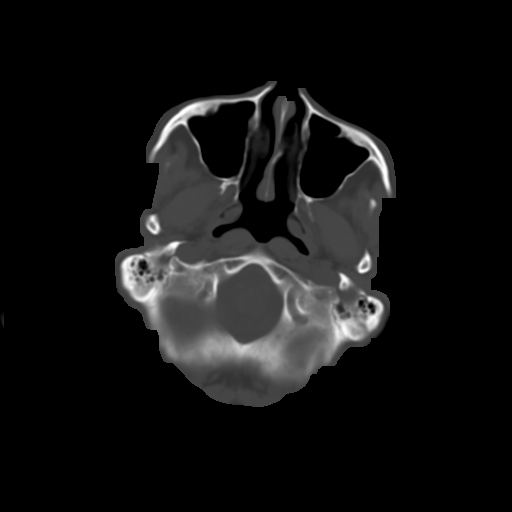
[im 5/32  brain]
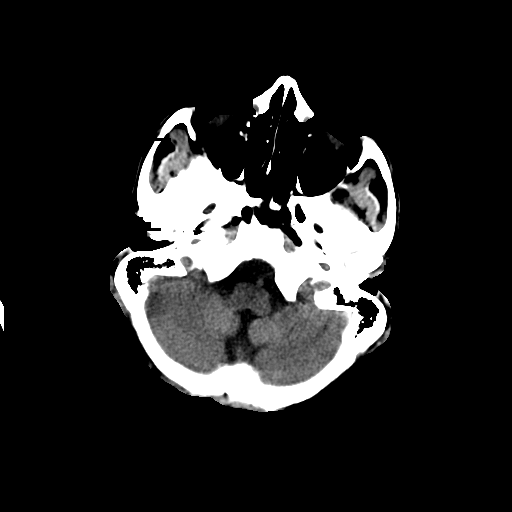
[im 7/32  brain]
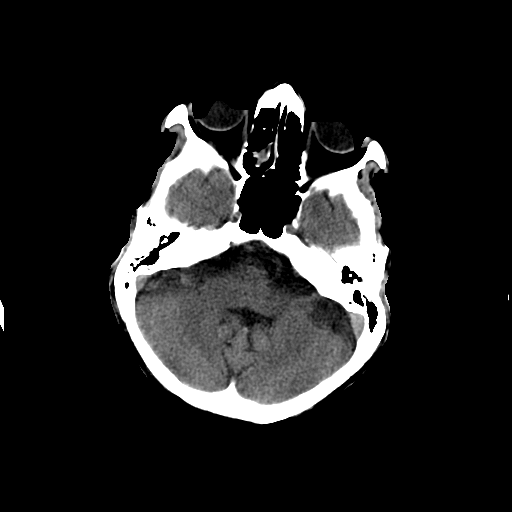
[im 9/32  brain]
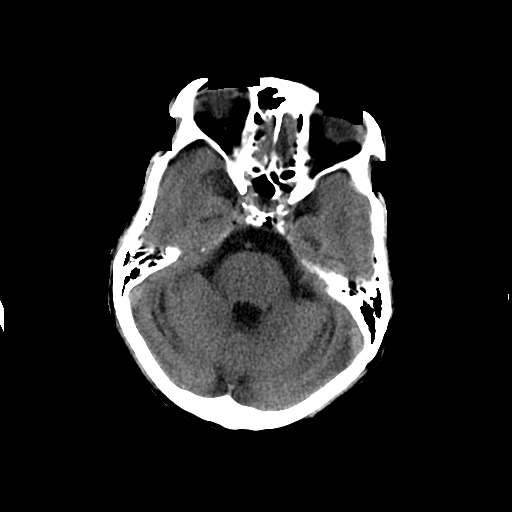
[im 12/32  brain]
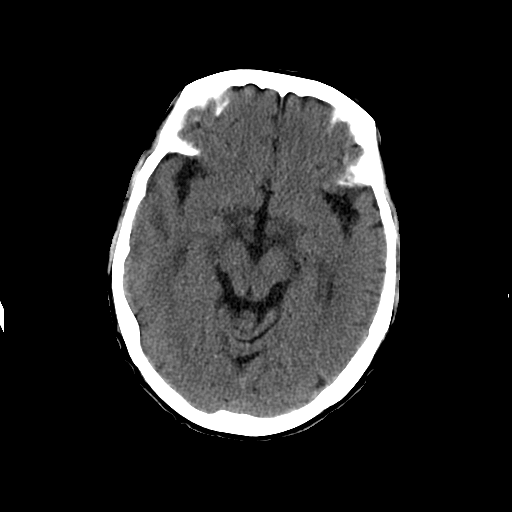
[im 12/32  bone]
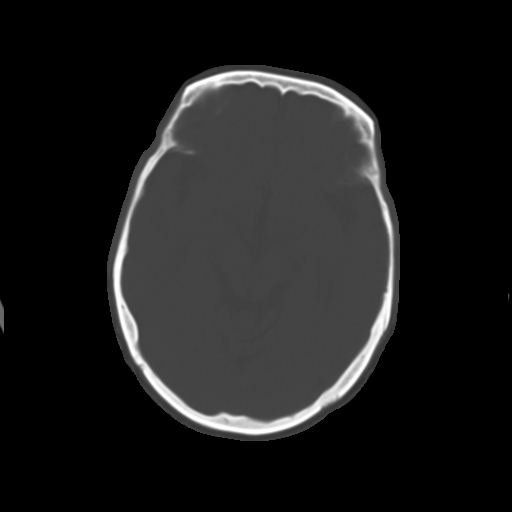
[im 14/32  brain]
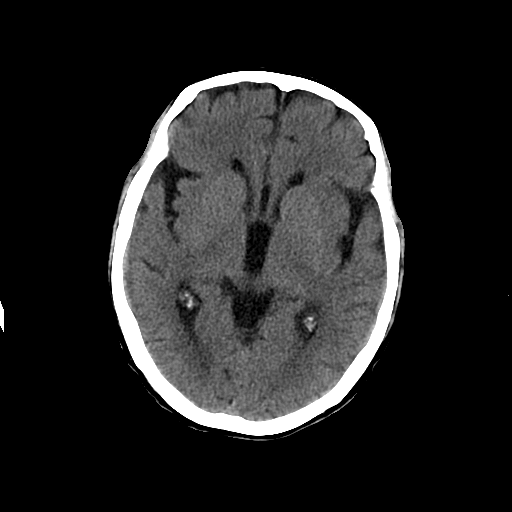
[im 16/32  brain]
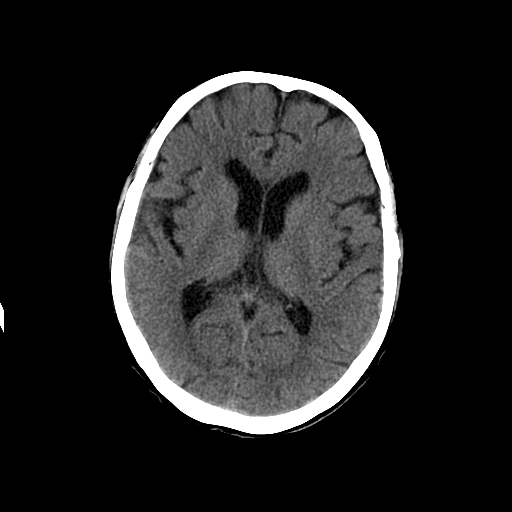
[im 18/32  brain]
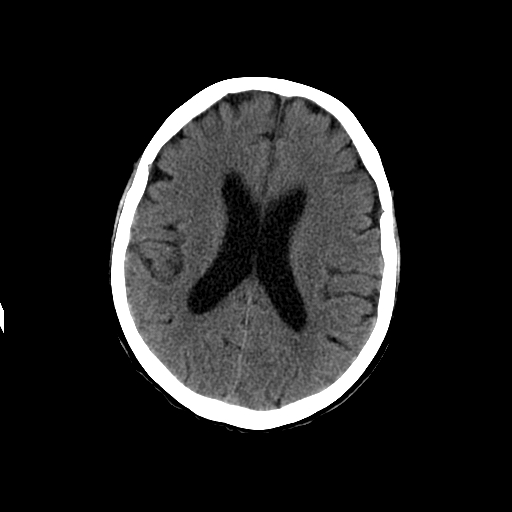
[im 20/32  brain]
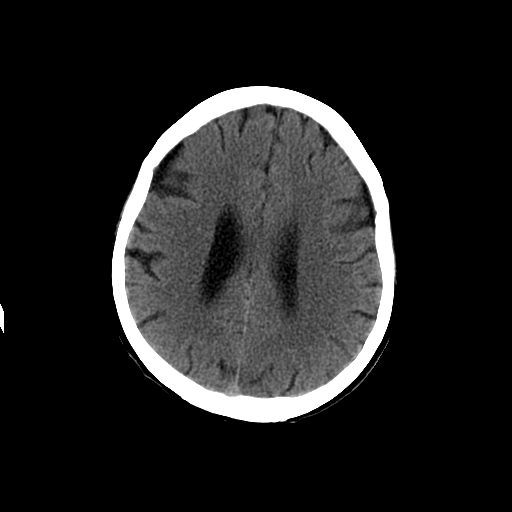
[im 20/32  bone]
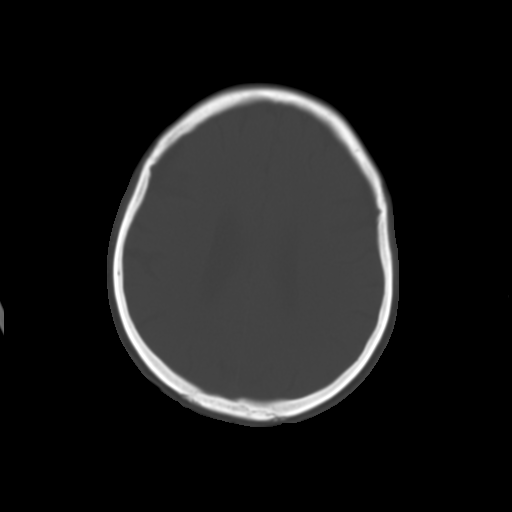
[im 23/32  brain]
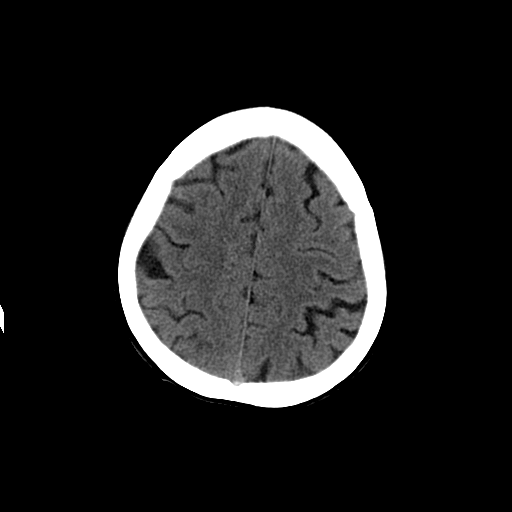
[im 25/32  brain]
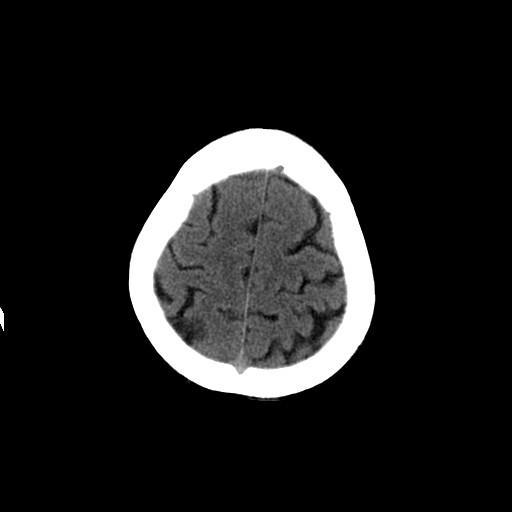
[im 27/32  brain]
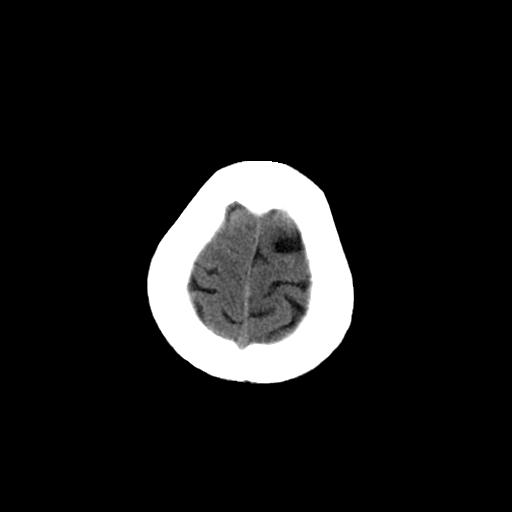
[im 29/32  brain]
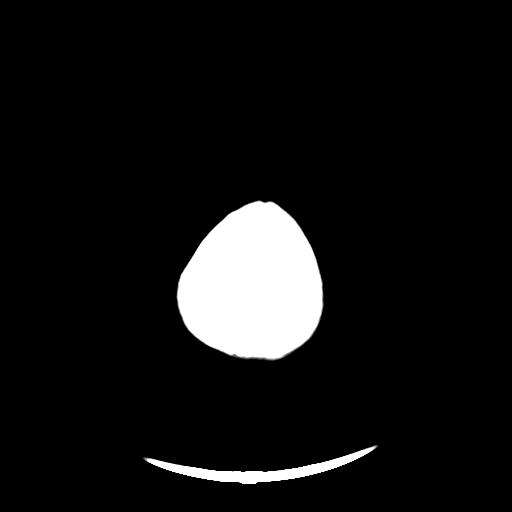
[im 29/32  bone]
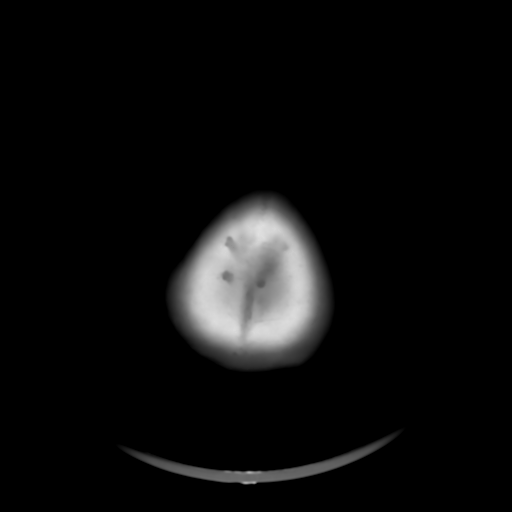

[Series 202: head w/o bone, idose (1) · axial · non-contrast · 0.49mm/px · z∈[+86,+131]mm · 3 of 32 slices shown]
[im 3/32  bone]
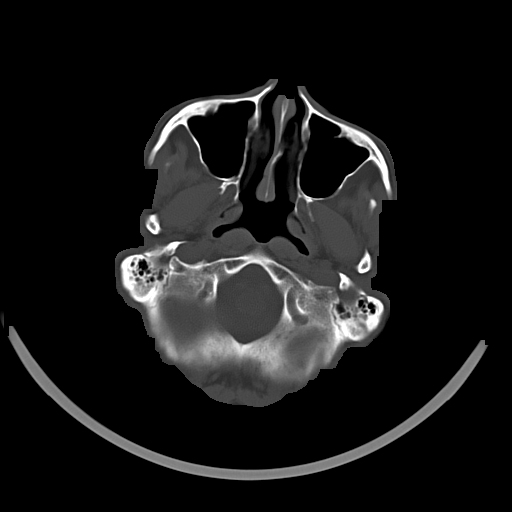
[im 7/32  bone]
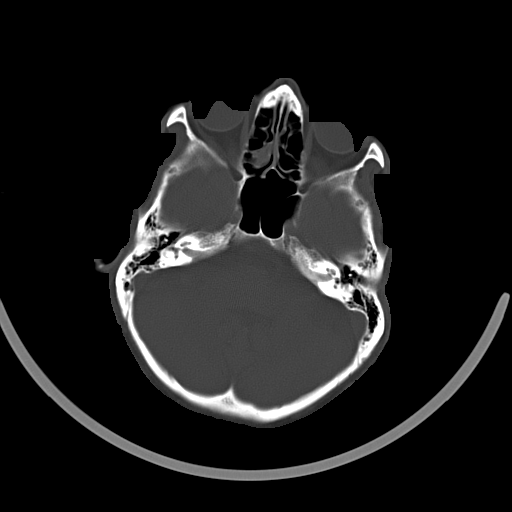
[im 12/32  bone]
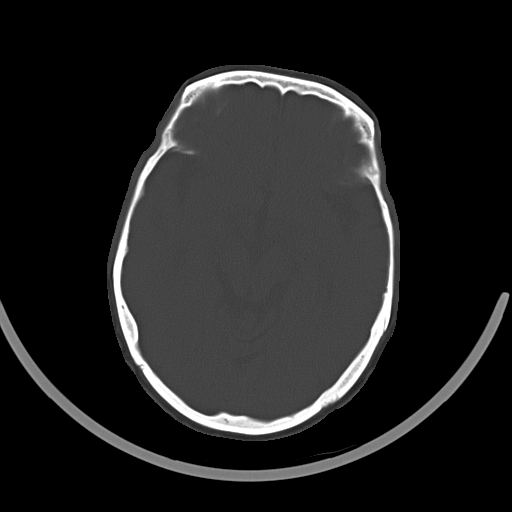

[16 of 30 positions shown; findings below may reference images not displayed]

FINDINGS: No acute hemorrhage, infarct, or mass lesion is identified. No
midline shift. Ventricles are normal in size. Orbits and paranasal
sinuses are unremarkable. No skull fracture. Mild ethmoid
mucoperiosteal thickening noted. No skull fracture. Orbits are
grossly unremarkable.
IMPRESSION: No acute intracranial finding.
